# Patient Record
Sex: Male | Born: 1950 | Race: White | Hispanic: No | Marital: Married | Smoking: Never smoker
Health system: Southern US, Community
[De-identification: ages and names within clinical notes are randomized; demographics above are authoritative.]

## PROBLEM LIST (undated history)

## (undated) DIAGNOSIS — E7800 Pure hypercholesterolemia, unspecified: Secondary | ICD-10-CM

## (undated) DIAGNOSIS — I219 Acute myocardial infarction, unspecified: Secondary | ICD-10-CM

## (undated) DIAGNOSIS — I2510 Atherosclerotic heart disease of native coronary artery without angina pectoris: Secondary | ICD-10-CM

## (undated) DIAGNOSIS — IMO0001 Reserved for inherently not codable concepts without codable children: Secondary | ICD-10-CM

## (undated) HISTORY — DX: Reserved for inherently not codable concepts without codable children: IMO0001

---

## 2006-05-31 DIAGNOSIS — I2510 Atherosclerotic heart disease of native coronary artery without angina pectoris: Secondary | ICD-10-CM

## 2006-05-31 HISTORY — PX: CARDIAC CATHETERIZATION: SHX172

## 2006-05-31 HISTORY — DX: Atherosclerotic heart disease of native coronary artery without angina pectoris: I25.10

## 2006-09-08 ENCOUNTER — Inpatient Hospital Stay (HOSPITAL_COMMUNITY): Admission: EM | Admit: 2006-09-08 | Discharge: 2006-09-10 | Payer: Self-pay | Admitting: Emergency Medicine

## 2006-12-30 HISTORY — PX: CORONARY ANGIOPLASTY: SHX604

## 2007-01-02 ENCOUNTER — Inpatient Hospital Stay (HOSPITAL_COMMUNITY): Admission: RE | Admit: 2007-01-02 | Discharge: 2007-01-03 | Payer: Self-pay | Admitting: Interventional Cardiology

## 2008-08-06 ENCOUNTER — Emergency Department (HOSPITAL_COMMUNITY): Admission: EM | Admit: 2008-08-06 | Discharge: 2008-08-06 | Payer: Self-pay | Admitting: Emergency Medicine

## 2010-10-13 NOTE — Discharge Summary (Signed)
Chad Savage, Chad Savage                 ACCOUNT NO.:  [id number]   MEDICAL RECORD NO.:  [id number]          PATIENT TYPE:  INP   LOCATION:  6533                         FACILITY:  [facility]   PHYSICIAN:  Corky Crafts, MDDATE OF BIRTH:  1951/02/28   DATE OF ADMISSION:  01/02/2007  DATE OF DISCHARGE:  01/03/2007                               DISCHARGE SUMMARY   DISCHARGE DIAGNOSES:  1. Coronary artery disease status post drug-eluting stent to the LAD  2. Hyperlipidemia, treated.  3. Long-term medication use.   HOSPITAL COURSE:  [name] is a 60 year old male patient who was  admitted to [facility] for cardiac catheterization on January 02, 2007.  He complained of substernal chest pain as an outpatient and underwent a  stress Cardiolite that showed a reversible anterior wall defect.   The patient then underwent a cardiac catheterization on January 02, 2007  that showed a 90% proximal LAD lesion; there had been a previous stent  in the mid-vessel; there was a 20% proximal in-stent restenosis; there  was a small second diagonal with an ostial 90% lesion, otherwise  angiographically normal.  EF normal.  Renal arteries showed that they  were widely patent.  Patient then underwent drug-eluting stent placement  to the LAD lesion without problem.  The patient remained in the hospital  overnight and was ready for discharge to home the following day after  ambulation.  Cardiac rehab phase I and phase II ordered.   LAB STUDIES:  Hemoglobin 13.8, hematocrit 40.9, BUN 10, creatinine 1.18.   DISCHARGE MEDICATIONS:  1. Lipitor 40 mg a day.  2. Enteric-coated aspirin 325 mg a day.  3. Plavix 75 mg a day.  4. Sublingual nitroglycerin p.r.n. chest pain.   Follow up with Dr. Eldridge Dace on January 17, 2007 at 9:45 a.m.   Clean cath site gently with soap and water; no lifting over ten pounds  for one week, no driving for two days.   The patient was discharged to home in stable and improved  condition.      Guy Franco, P.A.      Corky Crafts, MD  Electronically Signed    LB/MEDQ  D:  01/03/2007  T:  01/03/2007  Job:  [id number]

## 2010-10-13 NOTE — Cardiovascular Report (Signed)
Chad Savage, Chad Savage                 ACCOUNT NO.:  [id number]   MEDICAL RECORD NO.:  [id number]          PATIENT TYPE:  INP   LOCATION:  6533                         FACILITY:  [facility]   PHYSICIAN:  Corky Crafts, MDDATE OF BIRTH:  [birth year]   DATE OF PROCEDURE:  01/02/2007  DATE OF DISCHARGE:                            CARDIAC CATHETERIZATION   REFERRING PHYSICIAN:  Chales Salmon. Abigail Miyamoto, M.D.   PROCEDURES PERFORMED:  1. Left heart catheterization.  2. Left ventriculogram.  3. Coronary angiogram.  4. Abdominal aortogram.  5. Percutaneous coronary intervention  of the left anterior      descending.   OPERATOR:  Corky Crafts, MD., and primary operator on the  diagnostics was Veverly Fells. Skains, MD.   INDICATIONS:  Abnormal stress test and stable angina.   PROCEDURE NARRATIVE:  The risks and benefits of cardiac catheterization  were explained to the patient, and informed consent was obtained. The  patient was brought to the catheterization lab where he was prepped and  draped in the usual sterile fashion.  His right groin was infiltrated  with 1% lidocaine.  A 6-French arterial sheath was placed into his right  femoral artery using the modified Seldinger technique.  Left coronary  artery angiography was performed using JL-4 4.0 catheter.  The catheter  was advanced to the vessel ostium under fluoroscopic guidance.  Digital  angiography was performed in multiple projections using hand injection  of contrast. Right coronary artery angiography was then performed using  a JR-4.0 catheter.  This catheter was advanced to the vessel ostium  under fluoroscopic guidance.  Digital angiography was performed in  multiple projections using hand injection of contrast.  A pigtail  catheter was advanced to the ascending aorta and across the aortic valve  under fluoroscopic guidance.  Power injection of contrast was performed  in the third-degree RAO position to image the left ventricle.   The  catheter was pulled back under continuous hemodynamic pressure  monitoring. The catheter was pulled back to the level of the renal  arteries.  Power injection of contrast was performed in the AP  projection to visualize the abdominal aorta.  The PCI of the LAD was  performed.  Please see below for details.  The sheath was removed, and a  StarClose was deployed for hemostasis because the patient had a vagal  response during the catheterization.   FINDINGS:  Left main was widely patent.   The left circumflex was a large vessel.  Proximally, there were minor  luminal irregularities.  There is a large OM-1 with a 25% lesion along  with other minor irregularities.  The ramus is a small vessel with  moderate atherosclerosis proximally.   The left anterior descending was a large vessel.  There is a 90%  proximal lesion.  There is a patent mid vessel stent.  There was an area  of 20% in-stent restenosis within the proximal portion of the stent.  There is a small first diagonal. There is a small second diagonal at the  distal portion of the stent which had an ostial 90% lesion.  There  appeared to be an area of bridging in the mid LAD as well.   The right coronary artery was a medium-size dominant vessel.  There is a  small ectatic segment in the mid portion of the vessel. There is a  patent mid vessel stent.   Left ventriculogram shows normal left ventricular function with  estimated ejection fraction of 55%.   The abdominal aortogram showed no abdominal aortic aneurysm.  There are  single renal arteries bilaterally, both of which were widely patent.   HEMODYNAMIC RESULTS:  Left ventricular pressure of 101/71 with an LVEDP  of 13 mmHg.  Aortic pressure of 102/57 with a mean aortic pressure of 80  mmHg.   PERCUTANEOUS CORONARY INTERVENTION NARRATIVE:  A CLS 3.5 guiding  catheter was advanced in the ascending aorta and into the ostium of the  left main.  A Prowater wire was placed  across the lesion.  Angiomax was  used for anticoagulation.  A 2.5 x 12 mm Maverick balloon was placed  across the lesion and deployed at 10 atmospheres for 20 seconds.  A 2.5  x 16 mm TAXUS stent was then deployed across the lesion at 14  atmospheres for 28 seconds.  An IVUS was performed which showed that the  stented area with some under deployed. During the IVUS, the patient  developed anterior ST-segment-elevation. Intracoronary nitroglycerin was  administered along with intracoronary verapamil.  His ECG changes  resolved.  The patient developed bradycardia as well and some  hypotension.  He was given 0.5 mg of atropine with some relief.  Subsequently, the stented area was postdilated with a 3.0 x 12 mm  Quantum Maverick balloon. In the distal portion of the stented area,  this was inflated to 12 atmospheres for 39 seconds.  In a more proximal  portion of the stented area, the balloon was inflated to 16 atmospheres  for 28 seconds.  There was an excellent angiographic result with TIMI-3  flow.   IMPRESSION:  1. Successful percutaneous coronary intervention  of the left anterior      descending with a 2.5 x 60 mm TAXUS which was postdilated with a      3.0 x 12 Quantum balloon.  2. Patent mid right coronary artery stent.  3. Normal left ventricular function.  4. No renal artery stenosis.   RECOMMENDATIONS:  The patient will continue with aspirin and Plavix  indefinitely given the aggressive nature of his atherosclerosis.  We  will change his statin  to Lipitor in an attempt for a bigger anti-  inflammatory effect. The patient will be watched overnight, and if there  are no complications, we will plan on discharging him in the morning.      Corky Crafts, MD  Electronically Signed     JSV/MEDQ  D:  01/02/2007  T:  01/02/2007  Job:  [id number]   cc:   Chales Salmon. Abigail Miyamoto, M.D.

## 2010-10-16 NOTE — Cardiovascular Report (Signed)
NAMEOAK, DOREY                 ACCOUNT NO.:  [id number]   MEDICAL RECORD NO.:  [id number]          PATIENT TYPE:  INP   LOCATION:  6527                         FACILITY:  [facility]   PHYSICIAN:  Corky Crafts, MDDATE OF BIRTH:  Dec 16, 1950   DATE OF PROCEDURE:  09/09/2006  DATE OF DISCHARGE:                            CARDIAC CATHETERIZATION   REFERRING:  Dr. Corliss Marcus and Dr. Henrine Screws.   PROCEDURES PERFORMED:  Left heart catheterization, left ventriculogram,  coronary angiogram, IVUS of the right coronary artery, percutaneous  coronary intervention of the left anterior descending artery and  percutaneous coronary intervention of the right coronary artery.   OPERATOR:  Dr. Eldridge Dace.   INDICATIONS:  Non-ST-segment elevation MI.   PROCEDURE NARRATIVE:  The risks and benefits of cardiac catheterization  were explained to the patient, and informed consent was obtained.  The  patient was brought to the cath lab.  He was prepped and draped in the  usual sterile fashion.  His right groin was infiltrated with 1%  lidocaine.  A 6-French arterial sheath was placed into his right femoral  artery using the modified Seldinger technique.  Left coronary artery  angiography was performed using a JL-4.0 catheter.  The catheter was  advanced to the vessel ostium under fluoroscopic guidance.  Digital  angiography was performed in multiple projections using hand injection  of contrast.  Right coronary artery angiography was then performed using  a JR-4.0 catheter.  The catheter was advanced into the vessel ostium  under fluoroscopic guidance.  Digital angiography was performed in  multiple projections using hand injection of contrast.  A pigtail  catheter was then advanced into the ascending aorta and across the  aortic valve under fluoroscopic guidance.  Power injection of contrast  was performed in the 30-degree RAO position, visualized the left  ventricle.  The catheter was pulled  back under continuous hemodynamic  pressure monitoring.  The PCI of the LAD and then PCI of the RCA were  then performed.  Please see below for details.  The sheath was removed,  and a Starclose was deployed for hemostasis for patient comfort.  Heparin and Integrilin were used for anticoagulation during the  procedure.   FINDINGS:  The left main was widely patent.  The left circumflex had a  diffuse 25% proximal lesion.  There was a large OM1 with mild luminal  irregularities.  Left anterior descending was a medium-sized vessel with  diffuse mild disease proximally.  In the mid vessel, there was a focal  area of 95% stenosis at the origin of a small second diagonal.  The  first diagonal was also a small vessel which was patent.  There were  mild irregularities in the distal vessel.  The right coronary artery was  a medium-sized dominant vessel.  There is mild ectasia noted in the mid  segment.  There is a hazy 60% stenosis at the distal edge of the ectatic  area.  The remainder of the vessel had mild irregularities.  The left  ventriculogram showed normal left ventricular function with no mitral  regurgitation.  The estimated ejection fraction was 60%.   HEMODYNAMICS:  Left ventricular pressure of 95/3 with an LVEDP of 13  mmHg.  Aortic pressure of 94/55 with a mean aortic pressure of 72 mmHg.  PCI of the LAD was then performed.  A CLS 3.5 guiding catheter was  placed in the ostium of the left main under fluoroscopic guidance.  A  Prowater wire was placed down in the LAD.  A 2.25 x 9-mm Maverick  balloon was placed across the lesion and inflated to 8 atmospheres for  24 seconds and then at 7 atmospheres for 15 seconds.  A 2.5 x 12-mm  Taxus stent was then deployed at 12 atmospheres for 34 seconds across  the lesion in the mid LAD.  The stent was then post dilated with a 2.75  x 8-mm Quantum balloon inflated to 18 atmospheres for 22 seconds and  then to 18 atmospheres for 14 seconds.   There was an excellent  angiographic result.  There was TIMI 3 flow with no residual stenosis.  A JR-4 guiding catheter was then placed in the ostium of the right  coronary artery under fluoroscopic guidance.  A Prowater wire was  advanced across the lesion.  An IVUS catheter was placed down the  vessel.  In the area just distal to the ectatic segment, there is a  cross-sectional area of 2.2 mm, which was significantly stenotic  compared to the reference vessel.  A 2.5 x 12-mm Taxus stent was placed  across that lesion and deployed at 12 atmospheres for 35 seconds.  The  2.75 x 8-mm Quantum balloon was placed in the proximal edge of the stent  with part of it hanging into the aneurysm and deployed at 16 atmospheres  for 35 seconds.  Balloon was then advanced more distally and inflated to  14 atmospheres for 14 seconds to post dilate the rest of the stent.  There was an excellent angiographic result with no residual stenosis.   IMPRESSION:  1. Two-vessel coronary artery disease with normal left ventricular      function.  2. Successful drug-eluting stent placement into the mid LAD with 2.5 x      12-mm Taxus which was post dilated to approximately 2.87 mm in      diameter.  3. Successful drug-eluting stent placement in the mid right coronary      artery with 2.5 x 12-mm Taxus stent after an IVUS revealed a      significant mid RCA lesion.  4. Normal ventricular function.  5. Normal hemodynamics.   RECOMMENDATIONS:  The patient to continue aspirin and Plavix  indefinitely.  He will continue Integrilin for 18 hours and will use  secondary prevention with lipid-lowering therapy and beta blocker as  well indefinitely.  Assuming that there are no bleeding complications,  he will likely be able to be discharged tomorrow.      Corky Crafts, MD  Electronically Signed     JSV/MEDQ  D:  09/09/2006  T:  09/10/2006  Job:  [id number]

## 2010-10-16 NOTE — Discharge Summary (Signed)
Chad Savage, Chad Savage                 ACCOUNT NO.:  [id number]   MEDICAL RECORD NO.:  [id number]          PATIENT TYPE:  INP   LOCATION:  6527                         FACILITY:  [facility]   PHYSICIAN:  Corky Crafts, MDDATE OF BIRTH:  [birth year]   DATE OF ADMISSION:  09/08/2006  DATE OF DISCHARGE:  09/10/2006                               DISCHARGE SUMMARY   DISCHARGE DIAGNOSES:  1. Non-ST segment elevated myocardial infarction, status post drug-      eluting stent placement to the mid left anterior descending and mid      right coronary artery.  Normal left ventricular function.  2. Early family history of coronary artery disease.  3. Dyslipidemia, treated.   HOSPITAL COURSE:  Mr. Duerson is a 60 year old male patient who presented  to Dr. Recardo Evangelist office on September 08, 2006 with an intermittent 2 week  history of chest pain.  The pain had been worse over the past 48 hours.  EKG was reported as normal upon presentation to Dr. Recardo Evangelist office.   The patient was then transported to [facility] and was pain free upon  admission.  His initial troponin was 0.13.  Chest x-ray showed a  slightly tortuous aorta; otherwise, not acute.   Lab studies done, during the patient's hospital stay, include a white  count of 5.9, hemoglobin of 14.4, hematocrit 42.1, platelets 179.  Sodium 141, potassium 3.8, BUN 7, creatinine 0.96.  LFTs normal.  Maximum CK 139, MB fraction of 3.9, maximum troponin of 0.40.  Total  cholesterol 221, triglycerides 249, LDL 140, HDL 31.  TSH 1.558.   On September 09, 2006, the patient was then taken to the cardiac  catheterization lab under the guidance of Dr. Everette Rank.  Left main  was mildly patent, left circumflex had a proximal 25% lesion; otherwise,  just mild irregularities.  The LAD had a 95% lesion of the second  diagonal with mild irregularities noted in the distal vessel.  RCA had a  hazy 60% stenosis in the mid segment with the remainder of the vessel  with mild irregularities.  LV graft showed normal function.  No MR.  LV  EDP 13 mmHg.  Patient then underwent percutaneous intervention of the  LAD utilizing a drug-eluting stent with good results.  PCI of the RCA  required deployment of a TAXUS stent as well.   The patient remained on Integrilin for 18 hours.  He will need to remain  on aspirin and Plavix indefinitely.  Patient was also placed on a lipid  lowering agent.  Cardiac rehab was also consulted.   By September 10, 2006 the patient was ready for discharge home.   DISCHARGE MEDICATIONS:  1. Plavix 75 mg a day.  2. Aspirin 325 mg a day.  3. Pravastatin 40 mg a day.  4. Metoprolol ER 25 mg a day.   Follow up with Dr. Amil Amen in 1-2 weeks.  No heavy lifting for over 10  pounds for 1 week.  No driving for 2 days.  Increase activity slowly, as  instructed by cardiac rehab.  The patient is  to call for any questions  or concerns.      Guy Franco, P.A.      Corky Crafts, MD  Electronically Signed    LB/MEDQ  D:  10/28/2006  T:  10/29/2006  Job:  [id number]   cc:   Chales Salmon. Abigail Miyamoto, M.D.

## 2011-03-15 LAB — BASIC METABOLIC PANEL
BUN: 10
BUN: 15
CO2: 24
CO2: 25
Chloride: 108
Chloride: 108
Creatinine, Ser: 1.18
GFR calc non Af Amer: >60
Glucose, Bld: 95
Potassium: 4.2
Potassium: 4.4
Sodium: 141

## 2011-03-15 LAB — CBC
HCT: 40.9
HCT: 44.8
Hemoglobin: 13.8
Hemoglobin: 15.1
MCHC: 33.8
MCV: 87.7
MCV: 88.7
RBC: 5.05
RDW: 13.3
RDW: 13.5

## 2011-03-15 LAB — CK TOTAL AND CKMB (NOT AT ARMC)

## 2011-03-15 LAB — PROTIME-INR: INR: 1

## 2011-07-30 DIAGNOSIS — I712 Thoracic aortic aneurysm, without rupture, unspecified: Secondary | ICD-10-CM | POA: Insufficient documentation

## 2011-07-30 DIAGNOSIS — IMO0001 Reserved for inherently not codable concepts without codable children: Secondary | ICD-10-CM

## 2011-07-30 HISTORY — DX: Reserved for inherently not codable concepts without codable children: IMO0001

## 2011-08-17 ENCOUNTER — Other Ambulatory Visit: Payer: Self-pay | Admitting: Interventional Cardiology

## 2011-08-17 DIAGNOSIS — I712 Thoracic aortic aneurysm, without rupture, unspecified: Secondary | ICD-10-CM

## 2011-08-23 ENCOUNTER — Ambulatory Visit
Admission: RE | Admit: 2011-08-23 | Discharge: 2011-08-23 | Disposition: A | Discharge status: Home / Self Care | Payer: BC Managed Care – PPO | Source: Ambulatory Visit | Attending: Interventional Cardiology | Admitting: Interventional Cardiology

## 2011-08-23 ENCOUNTER — Other Ambulatory Visit: Payer: Self-pay | Admitting: Interventional Cardiology

## 2011-08-23 DIAGNOSIS — I712 Thoracic aortic aneurysm, without rupture, unspecified: Secondary | ICD-10-CM

## 2011-08-23 DIAGNOSIS — IMO0001 Reserved for inherently not codable concepts without codable children: Secondary | ICD-10-CM

## 2011-08-23 MED ORDER — GADOBENATE DIMEGLUMINE 529 MG/ML IV SOLN
16.0000 mL | Freq: Once | INTRAVENOUS | Status: AC | PRN
  Administered 2011-08-23: 16 mL via INTRAVENOUS

## 2012-06-02 ENCOUNTER — Other Ambulatory Visit: Payer: Self-pay | Admitting: Urology

## 2012-06-19 ENCOUNTER — Encounter (HOSPITAL_BASED_OUTPATIENT_CLINIC_OR_DEPARTMENT_OTHER): Payer: Self-pay | Admitting: *Deleted

## 2012-06-19 NOTE — Progress Notes (Signed)
To [facility] at 0615-Istat on arrival,Ekg with chart-denies any cardiac symptoms-exercises 4x/ week.Npo after Mn-

## 2012-06-19 NOTE — H&P (Signed)
ve Problems Problems  1. 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 is a 62 yo WM who I saw in 2009 with bilateral hydroceles.  We were going to fix them but he was recently on plavix for drug eluting stents and he was not cleared.  He continues to have bother from the hydroceles but they haven't grown much.  He is voiding ok but has some frequency.  He has nocturia x 1.  He has no obstructive symptoms or hematuria.   Past Medical History Problems  1. History of  Cath Stent Placement 2. History of  Esophageal Reflux 530.81 3. History of  Heart Disease 429.9 4. History of  Hypercholesterolemia 272.0  Surgical History Problems  1. History of  Eye Surgery 2. History of  Heart Surgery 3. History of  Leg Repair 4. History of  Wrist Surgery  Current Meds 1. Aspirin 81 MG Oral Tablet; Therapy: (Recorded:26Dec2013) to 2. Centrum Silver TABS; Therapy: (Recorded:26Dec2013) to 3. Glucosamine Chondroitin TABS; Therapy: (Recorded:26Dec2013) to 4. Plavix TABS; Therapy: (Recorded:26Dec2013) to  Allergies Medication  1. Sudafed Cough LIQD 2. Demerol TABS  Family History Problems  1. Family history of  Acute Myocardial Infarction V17.3 2. Maternal history of  Death In The Family Father lung ca. 3. Maternal history of  Death In The Family Mother stroke 4. Maternal history of  Family Health Status Number Of Children one son, 2 daughters 5. Maternal history of  Lung Cancer V16.1 6. Fraternal history of  Stroke Syndrome V17.1 7. Paternal history of  Thyroid Disorder V18.19  Social History Problems    Caffeine Use 1-2 soda per day   Marital History - Widowed   Occupation: Chartered certified accountant Denied    History of  Alcohol Use   History of  Tobacco Use V15.82  Review of Systems Genitourinary, constitutional, skin, eye, otolaryngeal, hematologic/lymphatic, cardiovascular, pulmonary, endocrine, musculoskeletal, gastrointestinal, neurological  and psychiatric system(s) were reviewed and pertinent findings if present are noted.  Genitourinary: urinary frequency, feelings of urinary urgency, nocturia, incontinence (minimal UUI) and erectile dysfunction.  ENT: sore throat and sinus problems.  Musculoskeletal: back pain.    Vitals Vital Signs [Data Includes: Last 1 Day]  26Dec2013 03:40PM  BMI Calculated: 29.32 BSA Calculated: 1.87 Height: 5 ft 5 in Weight: 176 lb  Blood Pressure: 142 / 85 Heart Rate: 79  Physical Exam Constitutional: Well nourished and well developed . No acute distress.  ENT:. The ears and nose are normal in appearance.  Neck: The appearance of the neck is normal and no neck mass is present.  Pulmonary: No respiratory distress and normal respiratory rhythm and effort.  Cardiovascular: Heart rate and rhythm are normal . No peripheral edema.  Abdomen: The abdomen is soft and nontender. No masses are palpated. No CVA tenderness. No hernias are palpable. No hepatosplenomegaly noted.  Rectal: The prostate exam was deferred.  Genitourinary: Examination of the penis demonstrates no lesions and a normal meatus. The scrotum is normal in appearance. Examination of the right scrotum demonstrates a hydrocele. Examination of the left scrotum demostrates a hydrocele. The right testis is nonpalpable. The left testis is nonpalpable.  Lymphatics: The femoral and inguinal nodes are not enlarged or tender.  Skin: Normal skin turgor, no visible rash and no visible skin lesions.  Neuro/Psych:. Mood and affect are appropriate.    Results/Data Urine [Data Includes: Last 1 Day]   26Dec2013  COLOR YELLOW   APPEARANCE CLEAR   SPECIFIC GRAVITY 1.015  pH 7.0   GLUCOSE NEG mg/dL  BILIRUBIN NEG   KETONE NEG mg/dL  BLOOD NEG   PROTEIN NEG mg/dL  UROBILINOGEN 0.2 mg/dL  NITRITE NEG   LEUKOCYTE ESTERASE TRACE   SQUAMOUS EPITHELIAL/HPF NONE SEEN   WBC 0-2 WBC/hpf  RBC NONE SEEN RBC/hpf  BACTERIA NONE SEEN   CRYSTALS NONE SEEN    CASTS NONE SEEN    Assessment Assessed  1. 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   Plan Health Maintenance (V70.0)  1. UA With REFLEX  Done: 26Dec2013 03:27PM Testicular Hydrocele (603.9)  2. Follow-up Schedule Surgery Office  Follow-up  Requested for: 26Dec2013   I will set him up for bilateral hydrocelectomy and have reviewed the risks of bleeding, infection, testicular injury, recurrent hydrocele, persistent pain and scarring, thrombotic events and anesthetic complications.  I will have him cleared by his cardiologist.   Discussion/Summary  CC: Dr. Berenda Morale and Dr. Eldridge Dace.

## 2012-06-19 NOTE — Progress Notes (Signed)
Have been unable to contact patient.Dr Belva Crome office has been made aware.Dr Okey Dupre made aware of cardiac history present in epic from 2008-unless a change in cardiac status and plavix and ASA held, will be OK to proceed in am.

## 2012-06-20 ENCOUNTER — Encounter (HOSPITAL_BASED_OUTPATIENT_CLINIC_OR_DEPARTMENT_OTHER): Payer: Self-pay | Admitting: Anesthesiology

## 2012-06-20 ENCOUNTER — Ambulatory Visit (HOSPITAL_BASED_OUTPATIENT_CLINIC_OR_DEPARTMENT_OTHER)
Admission: RE | Admit: 2012-06-20 | Discharge: 2012-06-20 | Disposition: A | Discharge status: Home / Self Care | Payer: BC Managed Care – PPO | Source: Ambulatory Visit | Attending: Urology | Admitting: Urology

## 2012-06-20 ENCOUNTER — Encounter (HOSPITAL_BASED_OUTPATIENT_CLINIC_OR_DEPARTMENT_OTHER): Payer: Self-pay

## 2012-06-20 ENCOUNTER — Encounter (HOSPITAL_BASED_OUTPATIENT_CLINIC_OR_DEPARTMENT_OTHER)
Admission: RE | Disposition: A | Discharge status: Home / Self Care | Payer: Self-pay | Source: Ambulatory Visit | Attending: Urology

## 2012-06-20 ENCOUNTER — Ambulatory Visit (HOSPITAL_BASED_OUTPATIENT_CLINIC_OR_DEPARTMENT_OTHER): Payer: BC Managed Care – PPO | Admitting: Anesthesiology

## 2012-06-20 DIAGNOSIS — Z7902 Long term (current) use of antithrombotics/antiplatelets: Secondary | ICD-10-CM | POA: Insufficient documentation

## 2012-06-20 DIAGNOSIS — K219 Gastro-esophageal reflux disease without esophagitis: Secondary | ICD-10-CM | POA: Insufficient documentation

## 2012-06-20 DIAGNOSIS — I519 Heart disease, unspecified: Secondary | ICD-10-CM | POA: Insufficient documentation

## 2012-06-20 DIAGNOSIS — Z7982 Long term (current) use of aspirin: Secondary | ICD-10-CM | POA: Insufficient documentation

## 2012-06-20 DIAGNOSIS — N433 Hydrocele, unspecified: Secondary | ICD-10-CM | POA: Insufficient documentation

## 2012-06-20 DIAGNOSIS — E7800 Pure hypercholesterolemia, unspecified: Secondary | ICD-10-CM | POA: Insufficient documentation

## 2012-06-20 HISTORY — DX: Acute myocardial infarction, unspecified: I21.9

## 2012-06-20 HISTORY — DX: Atherosclerotic heart disease of native coronary artery without angina pectoris: I25.10

## 2012-06-20 HISTORY — DX: Pure hypercholesterolemia, unspecified: E78.00

## 2012-06-20 HISTORY — PX: HYDROCELE EXCISION: SHX482

## 2012-06-20 LAB — POCT I-STAT 4, (NA,K, GLUC, HGB,HCT): Sodium: 139 mEq/L (ref 135–145)

## 2012-06-20 SURGERY — HYDROCELECTOMY
Anesthesia: General | Site: Groin | Laterality: Bilateral | Wound class: Clean

## 2012-06-20 MED ORDER — FENTANYL CITRATE 0.05 MG/ML IJ SOLN
25.0000 ug | INTRAMUSCULAR | Status: DC | PRN
  Filled 2012-06-20: qty 1

## 2012-06-20 MED ORDER — SODIUM CHLORIDE 0.9 % IJ SOLN
3.0000 mL | INTRAMUSCULAR | Status: DC | PRN
  Filled 2012-06-20: qty 3

## 2012-06-20 MED ORDER — LACTATED RINGERS IV SOLN
INTRAVENOUS | Status: DC
  Filled 2012-06-20: qty 1000

## 2012-06-20 MED ORDER — LIDOCAINE HCL (CARDIAC) 20 MG/ML IV SOLN
INTRAVENOUS | Status: DC | PRN
  Administered 2012-06-20: 50 mg via INTRAVENOUS

## 2012-06-20 MED ORDER — SODIUM CHLORIDE 0.9 % IJ SOLN
3.0000 mL | Freq: Two times a day (BID) | INTRAMUSCULAR | Status: DC
  Filled 2012-06-20: qty 3

## 2012-06-20 MED ORDER — BUPIVACAINE HCL (PF) 0.25 % IJ SOLN
INTRAMUSCULAR | Status: DC | PRN
  Administered 2012-06-20: 7 mL

## 2012-06-20 MED ORDER — ONDANSETRON HCL 4 MG/2ML IJ SOLN
4.0000 mg | Freq: Four times a day (QID) | INTRAMUSCULAR | Status: DC | PRN
  Filled 2012-06-20: qty 2

## 2012-06-20 MED ORDER — CEFAZOLIN SODIUM-DEXTROSE 2-3 GM-% IV SOLR
2.0000 g | INTRAVENOUS | Status: AC
  Administered 2012-06-20: 2 g via INTRAVENOUS
  Filled 2012-06-20: qty 50

## 2012-06-20 MED ORDER — OXYCODONE HCL 5 MG PO TABS
5.0000 mg | ORAL_TABLET | ORAL | Status: DC | PRN
  Filled 2012-06-20: qty 2

## 2012-06-20 MED ORDER — LACTATED RINGERS IV SOLN
INTRAVENOUS | Status: DC
  Administered 2012-06-20: 07:00:00 via INTRAVENOUS
  Filled 2012-06-20: qty 1000

## 2012-06-20 MED ORDER — PROPOFOL 10 MG/ML IV BOLUS
INTRAVENOUS | Status: DC | PRN
  Administered 2012-06-20: 150 mg via INTRAVENOUS

## 2012-06-20 MED ORDER — FENTANYL CITRATE 0.05 MG/ML IJ SOLN
INTRAMUSCULAR | Status: DC | PRN
  Administered 2012-06-20: 50 ug via INTRAVENOUS
  Administered 2012-06-20 (×2): 25 ug via INTRAVENOUS

## 2012-06-20 MED ORDER — HYDROCODONE-ACETAMINOPHEN 5-325 MG PO TABS: 1.0000 | ORAL_TABLET | Freq: Four times a day (QID) | ORAL | Status: DC | PRN

## 2012-06-20 MED ORDER — SODIUM CHLORIDE 0.9 % IV SOLN
250.0000 mL | INTRAVENOUS | Status: DC | PRN
  Filled 2012-06-20: qty 250

## 2012-06-20 MED ORDER — ACETAMINOPHEN 325 MG PO TABS
650.0000 mg | ORAL_TABLET | ORAL | Status: DC | PRN
  Filled 2012-06-20: qty 2

## 2012-06-20 MED ORDER — ONDANSETRON HCL 4 MG/2ML IJ SOLN
INTRAMUSCULAR | Status: DC | PRN
  Administered 2012-06-20: 4 mg via INTRAVENOUS

## 2012-06-20 MED ORDER — ACETAMINOPHEN 650 MG RE SUPP
650.0000 mg | RECTAL | Status: DC | PRN
  Filled 2012-06-20: qty 1

## 2012-06-20 MED ORDER — PROMETHAZINE HCL 25 MG/ML IJ SOLN
6.2500 mg | INTRAMUSCULAR | Status: DC | PRN
  Filled 2012-06-20: qty 1

## 2012-06-20 MED ORDER — HYDROCODONE-ACETAMINOPHEN 5-325 MG PO TABS
1.0000 | ORAL_TABLET | Freq: Four times a day (QID) | ORAL | Status: DC | PRN
  Administered 2012-06-20: 1 via ORAL
  Filled 2012-06-20: qty 1

## 2012-06-20 MED ORDER — MIDAZOLAM HCL 5 MG/5ML IJ SOLN
INTRAMUSCULAR | Status: DC | PRN
  Administered 2012-06-20: 2 mg via INTRAVENOUS

## 2012-06-20 SURGICAL SUPPLY — 35 items
BANDAGE GAUZE ELAST BULKY 4 IN (GAUZE/BANDAGES/DRESSINGS) ×2 IMPLANT
BLADE SURG 15 STRL LF DISP TIS (BLADE) ×1 IMPLANT
BLADE SURG 15 STRL SS (BLADE) ×2
BLADE SURG ROTATE 9660 (MISCELLANEOUS) ×2 IMPLANT
CANISTER SUCTION 1200CC (MISCELLANEOUS) IMPLANT
CANISTER SUCTION 2500CC (MISCELLANEOUS) ×2 IMPLANT
CLEANER CAUTERY TIP 5X5 PAD (MISCELLANEOUS) IMPLANT
CLOTH BEACON ORANGE TIMEOUT ST (SAFETY) ×2 IMPLANT
COVER MAYO STAND STRL (DRAPES) ×2 IMPLANT
COVER TABLE BACK 60X90 (DRAPES) ×2 IMPLANT
DISSECTOR ROUND CHERRY 3/8 STR (MISCELLANEOUS) IMPLANT
DRAIN PENROSE 18X1/4 LTX STRL (WOUND CARE) ×2 IMPLANT
DRAPE PED LAPAROTOMY (DRAPES) ×2 IMPLANT
ELECT REM PT RETURN 9FT ADLT (ELECTROSURGICAL) ×2
ELECTRODE REM PT RTRN 9FT ADLT (ELECTROSURGICAL) IMPLANT
GLOVE BIO SURGEON STRL SZ 6.5 (GLOVE) ×2 IMPLANT
GLOVE BIO SURGEON STRL SZ7 (GLOVE) ×1 IMPLANT
GLOVE ECLIPSE 6.0 STRL STRAW (GLOVE) ×2 IMPLANT
GLOVE SURG SS PI 8.0 STRL IVOR (GLOVE) ×2 IMPLANT
GOWN PREVENTION PLUS LG XLONG (DISPOSABLE) ×2 IMPLANT
GOWN STRL REIN XL XLG (GOWN DISPOSABLE) ×2 IMPLANT
NDL HYPO 25X1 1.5 SAFETY (NEEDLE) ×1 IMPLANT
NEEDLE HYPO 25X1 1.5 SAFETY (NEEDLE) ×2 IMPLANT
NS IRRIG 500ML POUR BTL (IV SOLUTION) ×2 IMPLANT
PACK BASIN DAY SURGERY FS (CUSTOM PROCEDURE TRAY) ×2 IMPLANT
PAD CLEANER CAUTERY TIP 5X5 (MISCELLANEOUS)
PENCIL BUTTON HOLSTER BLD 10FT (ELECTRODE) ×2 IMPLANT
SUPPORT SCROTAL LG STRP (MISCELLANEOUS) ×2 IMPLANT
SUT CHROMIC 3 0 SH 27 (SUTURE) ×5 IMPLANT
SYR CONTROL 10ML LL (SYRINGE) ×2 IMPLANT
TOWEL OR 17X24 6PK STRL BLUE (TOWEL DISPOSABLE) ×4 IMPLANT
TRAY DSU PREP LF (CUSTOM PROCEDURE TRAY) ×2 IMPLANT
TUBE CONNECTING 12X1/4 (SUCTIONS) ×2 IMPLANT
WATER STERILE IRR 500ML POUR (IV SOLUTION) ×2 IMPLANT
YANKAUER SUCT BULB TIP NO VENT (SUCTIONS) ×2 IMPLANT

## 2012-06-20 NOTE — Anesthesia Preprocedure Evaluation (Addendum)
Anesthesia Evaluation  Patient identified by MRN, date of birth, ID band Patient awake    Reviewed: Allergy & Precautions, H&P , NPO status , Patient's Chart, lab work & pertinent test results  Airway Mallampati: II TM Distance: >3 FB Neck ROM: Full    Dental No notable dental hx.    Pulmonary neg pulmonary ROS,  breath sounds clear to auscultation  Pulmonary exam normal       Cardiovascular Exercise Tolerance: Good + CAD, + Past MI, + Cardiac Stents (x3) and + Peripheral Vascular Disease (4cm stable ascending aortic aneurysm) Rhythm:Regular Rate:Normal     Neuro/Psych negative neurological ROS  negative psych ROS   GI/Hepatic negative GI ROS, Neg liver ROS,   Endo/Other  negative endocrine ROS  Renal/GU negative Renal ROS  negative genitourinary   Musculoskeletal negative musculoskeletal ROS (+)   Abdominal   Peds negative pediatric ROS (+)  Hematology negative hematology ROS (+)   Anesthesia Other Findings Upper front caps  Reproductive/Obstetrics negative OB ROS                          Anesthesia Physical Anesthesia Plan  ASA: III  Anesthesia Plan: General   Post-op Pain Management:    Induction: Intravenous  Airway Management Planned: LMA  Additional Equipment:   Intra-op Plan:   Post-operative Plan:   Informed Consent: I have reviewed the patients History and Physical, chart, labs and discussed the procedure including the risks, benefits and alternatives for the proposed anesthesia with the patient or authorized representative who has indicated his/her understanding and acceptance.   Dental advisory given  Plan Discussed with: CRNA  Anesthesia Plan Comments:         Anesthesia Quick Evaluation

## 2012-06-20 NOTE — Interval H&P Note (Signed)
History and Physical Interval Note:  06/20/2012 7:22 AM  Chad Savage  has presented today for surgery, with the diagnosis of BILATERAL HYDROCELES  The various methods of treatment have been discussed with the patient and family. After consideration of risks, benefits and other options for treatment, the patient has consented to  Procedure(s) (LRB) with comments: HYDROCELECTOMY ADULT (Bilateral) as a surgical intervention .  The patient's history has been reviewed, patient examined, no change in status, stable for surgery.  I have reviewed the patient's chart and labs.  Questions were answered to the patient's satisfaction.     [name] J

## 2012-06-20 NOTE — Brief Op Note (Signed)
06/20/2012  8:17 AM  PATIENT:  Adan A Sturgill  62 y.o. male  PRE-OPERATIVE DIAGNOSIS:  BILATERAL HYDROCELES  POST-OPERATIVE DIAGNOSIS:  BILATERAL HYDROCELES  PROCEDURE:  Procedure(s) (LRB) with comments: HYDROCELECTOMY ADULT (Bilateral)  SURGEON:  Surgeon(s) and Role:    * Anner Crete, MD - Primary  PHYSICIAN ASSISTANT:   ASSISTANTS: none   ANESTHESIA:   general  EBL:  Total I/O In: 200 [I.V.:200] Out: -   BLOOD ADMINISTERED:none  DRAINS: Penrose drain in the right and left scrotum   LOCAL MEDICATIONS USED:  MARCAINE     SPECIMEN:  No Specimen  DISPOSITION OF SPECIMEN:  N/A  COUNTS:  YES  TOURNIQUET:  * No tourniquets in log *  DICTATION: .Other Dictation: Dictation Number [id number]  PLAN OF CARE: Discharge to home after PACU  PATIENT DISPOSITION:  PACU - hemodynamically stable.   Delay start of Pharmacological VTE agent (>24hrs) due to surgical blood loss or risk of bleeding: not applicable

## 2012-06-20 NOTE — Op Note (Signed)
[name], Chad Savage                 ACCOUNT NO.:  [id number]  MEDICAL RECORD NO.:  [id number]  LOCATION:                               FACILITY:  [facility]  PHYSICIAN:  Excell Seltzer. Annabell Howells, M.D.    DATE OF BIRTH:  1950/11/23  DATE OF PROCEDURE:  06/20/2012 DATE OF DISCHARGE:                              OPERATIVE REPORT   PROCEDURE:  Bilateral hydrocelectomy.  PREOPERATIVE DIAGNOSIS:  Bilateral hydroceles.  POSTOPERATIVE DIAGNOSIS:  Bilateral hydroceles.  SURGEON:  Excell Seltzer. Annabell Howells, M.D.  ANESTHESIA:  General.  SPECIMEN:  None.  DRAINS:  Bilateral quarter-inch Penrose scrotal drains.  ESTIMATED BLOOD LOSS:  Minimal.  COMPLICATIONS:  None.  INDICATIONS:  Chad Savage is a 62 year old white male with a history of bilateral hydroceles.  When he was originally seen, he had just been placed on Plavix following placement of drug-eluting stent, and had a delay therapy, returns now with further increase in size of the hydroceles and the desire for repair.  FINDINGS OF PROCEDURE:  He was given 2 g of Ancef.  General anesthetic was induced with him in the supine position.  His genitalia was clipped. He was prepped with Betadine solution and draped in usual sterile fashion.  An anterior scrotal incision was made along the midline raphe with a knife.  The dartos was then incised with the Bovie.  The right hydrocele sac was pressed against the dartos and incised and the hydrocele was delivered from the scrotum.  The sac was opened and drained and the redundant hydrocele sac was removed.  A 3-0 chromic was used to imbricate the residual sac behind the testicle in a water bottle fashion with a running lock stitch.  Once the right hydrocelectomy had been completed, cord block was performed, approximately 4 mL of 0.25% Marcaine, and the testicle was returned to the scrotum.  The left hydrocele was then delivered through the same incision.  The redundant sac was resected.  A water bottle imbrication was  then performed once again with a running locked 3-0 chromic, and 4 mL of 0.25% Marcaine was used to perform a cord block.  At this point, the scrotal cavities were inspected.  No active bleeding was noted except along the dartos edge.  Stab wounds were placed on the right and left in the dependent portion of the scrotum and quarter-inch Penrose drains were placed adjacent to the hydrocele repair.  These were secured with towel clips while the closure was performed.  Once hemostasis was secured, the dartos was closed with a running 3-0 chromic.  The skin was then closed with interrupted vertical mattress 3- 0 chromic suture.  The drains were trimmed.  The scrotum was clean.  The towel clips were removed.  A dressing of 4x4s, fluff Kerlix, and scrotal support was applied.  The patient's anesthetic was reversed and he was moved to the recovery room in stable condition.  There were no complications.     Excell Seltzer. Annabell Howells, M.D.     JJW/MEDQ  D:  06/20/2012  T:  06/20/2012  Job:  [id number]

## 2012-06-20 NOTE — Transfer of Care (Signed)
Immediate Anesthesia Transfer of Care Note  Patient: Chad Savage  Procedure(s) Performed: Procedure(s) (LRB): HYDROCELECTOMY ADULT (Bilateral)  Patient Location: PACU  Anesthesia Type: General  Level of Consciousness: awake, oriented, sedated and patient cooperative  Airway & Oxygen Therapy: Patient Spontanous Breathing and Patient connected to face mask oxygen  Post-op Assessment: Report given to PACU RN and Post -op Vital signs reviewed and stable  Post vital signs: Reviewed and stable  Complications: No apparent anesthesia complications

## 2012-06-20 NOTE — Anesthesia Postprocedure Evaluation (Signed)
  Anesthesia Post-op Note  Patient: Chad Savage  Procedure(s) Performed: Procedure(s) (LRB): HYDROCELECTOMY ADULT (Bilateral)  Patient Location: PACU  Anesthesia Type: General  Level of Consciousness: awake and alert   Airway and Oxygen Therapy: Patient Spontanous Breathing  Post-op Pain: mild  Post-op Assessment: Post-op Vital signs reviewed, Patient's Cardiovascular Status Stable, Respiratory Function Stable, Patent Airway and No signs of Nausea or vomiting  Last Vitals:  Filed Vitals:   06/20/12 0900  BP: 135/87  Pulse: 65  Temp:   Resp: 13    Post-op Vital Signs: stable   Complications: No apparent anesthesia complications

## 2012-06-20 NOTE — Anesthesia Procedure Notes (Signed)
Procedure Name: LMA Insertion Date/Time: 06/20/2012 7:30 AM Performed by: Renella Cunas D Pre-anesthesia Checklist: Patient identified, Emergency Drugs available, Suction available and Patient being monitored Patient Re-evaluated:Patient Re-evaluated prior to inductionOxygen Delivery Method: Circle System Utilized Preoxygenation: Pre-oxygenation with 100% oxygen Intubation Type: IV induction Ventilation: Mask ventilation without difficulty LMA: LMA inserted LMA Size: 4.0 Number of attempts: 1 Airway Equipment and Method: bite block Placement Confirmation: positive ETCO2 Tube secured with: Tape Dental Injury: Teeth and Oropharynx as per pre-operative assessment

## 2012-06-21 ENCOUNTER — Encounter (HOSPITAL_BASED_OUTPATIENT_CLINIC_OR_DEPARTMENT_OTHER): Payer: Self-pay | Admitting: Urology

## 2013-03-12 ENCOUNTER — Other Ambulatory Visit: Payer: Self-pay | Admitting: Interventional Cardiology

## 2013-03-12 DIAGNOSIS — I712 Thoracic aortic aneurysm, without rupture, unspecified: Secondary | ICD-10-CM

## 2013-03-19 ENCOUNTER — Other Ambulatory Visit: Payer: Self-pay | Admitting: *Deleted

## 2013-03-19 DIAGNOSIS — Z79899 Other long term (current) drug therapy: Secondary | ICD-10-CM

## 2013-03-19 DIAGNOSIS — E785 Hyperlipidemia, unspecified: Secondary | ICD-10-CM

## 2013-04-17 ENCOUNTER — Other Ambulatory Visit: Payer: BC Managed Care – PPO

## 2013-04-20 ENCOUNTER — Encounter (INDEPENDENT_AMBULATORY_CARE_PROVIDER_SITE_OTHER): Payer: Self-pay

## 2013-04-20 ENCOUNTER — Ambulatory Visit (INDEPENDENT_AMBULATORY_CARE_PROVIDER_SITE_OTHER): Payer: 59 | Admitting: Pharmacist

## 2013-04-20 DIAGNOSIS — E785 Hyperlipidemia, unspecified: Secondary | ICD-10-CM

## 2013-04-20 DIAGNOSIS — Z79899 Other long term (current) drug therapy: Secondary | ICD-10-CM

## 2013-04-20 LAB — LIPID PANEL
Cholesterol: 107 mg/dL (ref 0–200)
HDL: 36.3 mg/dL — ABNORMAL LOW (ref >39.00)
Triglycerides: 86 mg/dL (ref 0.0–149.0)
VLDL: 17.2 mg/dL (ref 0.0–40.0)

## 2013-04-20 LAB — HEPATIC FUNCTION PANEL
ALT: 15 U/L (ref 0–53)
Albumin: 3.9 g/dL (ref 3.5–5.2)
Bilirubin, Direct: 0.1 mg/dL (ref 0.0–0.3)
Total Protein: 6.9 g/dL (ref 6.0–8.3)

## 2013-04-20 MED ORDER — EZETIMIBE-SIMVASTATIN 10-40 MG PO TABS: 1.0000 | ORAL_TABLET | Freq: Every day | ORAL | Status: DC

## 2013-04-20 NOTE — Assessment & Plan Note (Addendum)
Excellent improvement in LDL and non-HDL since changing simvastatin 40 mg to Vytorin 10/40 mg qd.  Patient tolerating Vytorin well, also on Co-Q 10 300 mg qd.  Patient's diet and lifestyle acceptable, and won't make any further changes at this time.  Given his family h/o premature CAD, and his mutli-vessel disease and low HDL, will check an NMR in 4 months to assess LDL-P.  If LDL-P > 1000 nmol/L may need to consider change in therapy.  If LDL-P < 1000 nmol/L however, will continue therapy and get traditional panel moving forward.   Will recheck blood work on 08/16/13 and see me 08/21/13 to review results.

## 2013-04-20 NOTE — Progress Notes (Signed)
Patient presenting to lipid clinic today due to h/o elevated LDL and CAD.  Patient has h/o muscle/joint pain on statins, and was changed from simvastatin 40 mg  to Vytorin 10/40 mg in 12/2012 for potency reasons.  Patient tells me he is tolerating Vytorin 10/40 mg qd well, especially since taking Co-Enzyme Q-10 300 mg daily.  He also takes glucosamine 1500 mg daily.  Risk factors:  Multi-vessel stents, family h/o premature CAD, age, h/o low HDL- LDL goal < 70, non-HDL goal < 100 Meds:  Vytorin 10/40 mg qd, CholestOff 1 tablet bid, fish oil 3 g/d, Co-Enzyme Q-10 300 mg daily. Intolerant:  Lipitor, Crestor (muscle/joint aches).  Patient had joint aches with Simvastatin 40 mg in the past, but this resolved after starting CoQ-10, and has even improved further since changing to Vytorin 10/40 mg qd per patient.  Social history:  No alcohol, no tobacco use Family History:  Mother -had MI in her 49's.  Father - had MI at 40 y.o., and died of lung cancer (he worked Tree surgeon and a Education administrator w/ lead based paint).  Diet:  Patient does eat a low fat diet.  Breakfast - oatmeal, mixed nuts, raisons, water, and coffee.  Lunch - sandwich, fruit, and water.  Dinner - Chicken or fish, and a vegetable.  Patient doesn't drink soda or alcohol.  Eats an occasional dessert, but his is rare. Exercise:  Patient walks 4 days per week - 30-45 minutes each time.  Labs:   04/20/13 (Today) -   LDL 54, non-HDL 71, TG 86, HDL 36 ; LFTs WNL (on Vytorin 10/40 mg qd and CholestOff bid) 01/11/2013 - LDL 89, non-HDL 110, TC 146, HDL 36 (on simvastatin 40 mg qd and CholestOff bid)  Current Outpatient Prescriptions  Medication Sig Dispense Refill  . Coenzyme Q10 (CO Q-10) 300 MG CAPS Take 300 mg by mouth daily.      Marland Kitchen ezetimibe-simvastatin (VYTORIN) 10-40 MG per tablet Take 1 tablet by mouth daily.      . Plant Sterols and Stanols (CHOLESTOFF) 450 MG TABS Take 450 mg by mouth 2 (two) times daily.      Marland Kitchen aspirin 81 MG tablet  Take 81 mg by mouth daily.      . clopidogrel (PLAVIX) 75 MG tablet Take 75 mg by mouth daily.      . Glucosamine-Chondroit-Vit C-Mn (GLUCOSAMINE CHONDR 1500 COMPLX PO) Take by mouth daily.      Marland Kitchen HYDROcodone-acetaminophen (NORCO) 5-325 MG per tablet Take 1 tablet by mouth every 6 (six) hours as needed for pain.  30 tablet  0  . Multiple Vitamins-Minerals (CENTRUM PO) Take by mouth.      . Omega-3 Fatty Acids (FISH OIL) 1200 MG CAPS Take by mouth 3 (three) times daily.       No current facility-administered medications for this visit.   Allergies  Allergen Reactions  . Demerol [Meperidine] Other (See Comments)    Cold sweats  . Antihistamines, Chlorpheniramine-Type Rash       . Sudafed [Pseudoephedrine Hcl] Rash

## 2013-04-20 NOTE — Patient Instructions (Addendum)
1.  Continue Vytorin 10/40 mg qd. 2.  Continue CholestOff twice daily. 3.  Continue exercise regimen and diet. 4.  Check NMR / Liver blood work on 08/16/13, and see me 08/23/13 to discuss.

## 2013-06-07 ENCOUNTER — Telehealth: Payer: Self-pay | Admitting: Interventional Cardiology

## 2013-06-07 MED ORDER — SIMVASTATIN 40 MG PO TABS: 40.0000 mg | ORAL_TABLET | Freq: Every day | ORAL | Status: DC

## 2013-06-07 NOTE — Telephone Encounter (Signed)
To Chad Savage, is there another option?

## 2013-06-07 NOTE — Telephone Encounter (Signed)
New message   [place] not [place] vytorin   . The co pay is high  $ 230.00

## 2013-06-07 NOTE — Telephone Encounter (Signed)
Patient and I discussed the situation.  His LDL was elevated on simvastatin 40 mg in the past, and he also developed some muscle aches in the past on this.  He tolerates Vytorin 10/40 mg qd well, so he will likely be able to tolerate simvastatin 40 mg qd.  He failed lipitor and Crestor in past (myalgias).  Patient would like to change vytorin to simvastatin 40 mg qd for cost reasons, and if LDL is elevated in 07/2013, he would like to try and enroll into PCSK-9 clinical trial.  We will discuss in lipid clinic in 07/2013.  Rx for simvastatin sent to [facility].

## 2013-06-07 NOTE — Telephone Encounter (Signed)
Patient failed lipitor and Crestor.  Welchol will be very expensive as well.  Could do simvastatin 40 mg qd monotherapy, then try to enrol

## 2013-08-08 ENCOUNTER — Encounter: Payer: Self-pay | Admitting: Interventional Cardiology

## 2013-08-08 DIAGNOSIS — I2510 Atherosclerotic heart disease of native coronary artery without angina pectoris: Secondary | ICD-10-CM | POA: Insufficient documentation

## 2013-08-08 DIAGNOSIS — E7800 Pure hypercholesterolemia, unspecified: Secondary | ICD-10-CM | POA: Insufficient documentation

## 2013-08-16 ENCOUNTER — Other Ambulatory Visit: Payer: Medicare Other | Admitting: *Deleted

## 2013-08-16 ENCOUNTER — Encounter: Payer: Self-pay | Admitting: Interventional Cardiology

## 2013-08-16 ENCOUNTER — Ambulatory Visit (INDEPENDENT_AMBULATORY_CARE_PROVIDER_SITE_OTHER): Payer: Medicare Other | Admitting: Interventional Cardiology

## 2013-08-16 VITALS — BP 100/70 | HR 65 | Ht 65.0 in | Wt 181.0 lb

## 2013-08-16 DIAGNOSIS — IMO0001 Reserved for inherently not codable concepts without codable children: Secondary | ICD-10-CM

## 2013-08-16 DIAGNOSIS — E785 Hyperlipidemia, unspecified: Secondary | ICD-10-CM

## 2013-08-16 DIAGNOSIS — I2510 Atherosclerotic heart disease of native coronary artery without angina pectoris: Secondary | ICD-10-CM

## 2013-08-16 DIAGNOSIS — E7800 Pure hypercholesterolemia, unspecified: Secondary | ICD-10-CM

## 2013-08-16 DIAGNOSIS — I712 Thoracic aortic aneurysm, without rupture, unspecified: Secondary | ICD-10-CM

## 2013-08-16 DIAGNOSIS — Z79899 Other long term (current) drug therapy: Secondary | ICD-10-CM

## 2013-08-16 NOTE — Addendum Note (Signed)
Addended byOrlene Plum: Sevag Shearn H on: 08/16/2013 10:47 AM   Modules accepted: Orders

## 2013-08-16 NOTE — Patient Instructions (Signed)
Your physician recommends that you continue on your current medications as directed. Please refer to the Current Medication list given to you today.  Your physician wants you to follow-up in: 1 Year with Dr. Varanasi. You will receive a reminder letter in the mail two months in advance. If you don't receive a letter, please call our office to schedule the follow-up appointment.  

## 2013-08-16 NOTE — Progress Notes (Signed)
Patient ID: Chad MaxinMikel A Burack, male   DOB: 12-23-1950, 63 y.o.   MRN: [id number]    [address] [place], [place]  [address] Phone: [phone] Fax:  [phone]  Date:  08/16/2013   ID:  Chad Savage, DOB 12-23-1950, MRN [id number]  PCP:  Rozanna BoxBABAOFF, MARC E, MD      History of Present Illness: Chad MaxinMikel A Dallman is a 63 y.o. male with multivessel stents. He has some joint pains and lightheadedness when he is tired. No syncope. He had a hydrocele opration in 2014 and tolerated this well. CAD/ASCVD:  Meredeth IdeWalks , pushups for exercise. Joint pain limits him somewhat but better now. Walks 30 minutes 4 times / week. c/o Dizziness while getting up from sitting position, mild, lasting for seconds.  Denies : Chest pain.  Leg edema.  Nitroglycerin.  Orthopnea.  Palpitations.  Paroxysmal nocturnal dyspnea.  Syncope.     Wt Readings from Last 3 Encounters:  08/16/13 181 lb (82.101 kg)  06/20/12 182 lb 8 oz (82.781 kg)  06/20/12 182 lb 8 oz (82.781 kg)     Past Medical History  Diagnosis Date  . Coronary artery disease 2008  . AAA (abdominal aortic aneurysm) March 2013    4 cm without complicating features  . Hypercholesteremia   . Myocardial infarction     Current Outpatient Prescriptions  Medication Sig Dispense Refill  . aspirin 81 MG tablet Take 81 mg by mouth daily.      . clopidogrel (PLAVIX) 75 MG tablet Take 75 mg by mouth daily.      . Coenzyme Q10 (CO Q-10) 300 MG CAPS Take 300 mg by mouth daily.      . Glucosamine-Chondroit-Vit C-Mn (GLUCOSAMINE CHONDR 1500 COMPLX PO) Take by mouth daily.      . Multiple Vitamins-Minerals (CENTRUM PO) Take by mouth.      . Omega-3 Fatty Acids (FISH OIL) 1200 MG CAPS Take by mouth 3 (three) times daily.      . Plant Sterols and Stanols (CHOLESTOFF) 450 MG TABS Take 450 mg by mouth 2 (two) times daily.      . simvastatin (ZOCOR) 40 MG tablet Take 1 tablet (40 mg total) by mouth at bedtime.  30 tablet  5   No current  facility-administered medications for this visit.    Allergies:    Allergies  Allergen Reactions  . Demerol [Meperidine] Other (See Comments)    Cold sweats  . Antihistamines, Chlorpheniramine-Type Rash       . Sudafed [Pseudoephedrine Hcl] Rash    Social History:  The patient  reports that he has never smoked. He does not have any smokeless tobacco history on file. He reports that he does not drink alcohol.   Family History:  The patient's family history is not on file.   ROS:  Please see the history of present illness.  No nausea, vomiting.  No fevers, chills.  No focal weakness.  No dysuria.    All other systems reviewed and negative.   PHYSICAL EXAM: VS:  BP 100/70  Pulse 65  Ht 5\' 5"  (1.651 m)  Wt 181 lb (82.101 kg)  BMI 30.12 kg/m2 Well nourished, well developed, in no acute distress HEENT: normal Neck: no JVD, no carotid bruits Cardiac:  normal S1, S2; RRR;  Lungs:  clear to auscultation bilaterally, no wheezing, rhonchi or rales Abd: soft, nontender, no hepatomegaly Ext: no edema Skin: warm and dry Neuro:   no focal abnormalities noted  EKG:  Normal     ASSESSMENT AND PLAN:  Coronary atherosclerosis of native coronary artery  Continue Aspirin Tablet, 81 mg, 2 tablets, Orally, Daily Continue Nitroglycerin SL tablet, 0.4 mg, as directed Continue Plavix Tablet, 75 MG, 1 tablet, Orally, Once a day Notes: No angina.  3/15 electrolytes stable   2. Old myocardial infarction  Notes: No CHF.    3. Hypercholesteremia, pure  Continue Fish Oil Double Strength Capsule, 1200 MG, 3 capsules, Orally, daily Continue Cholest Off Tablet, 450 MG, 2 tablets, Orally, daily Continue Simvastatin Tablet, 40 MG, 1 tablet every evening, Orally, Once a day  Notes: LDL 138 in 3/14. Significant increase. Simvastatin was increased. LDL improved to < 100. Did not tolerate atorvastatin.  3/15: HDL 33; LDL 75, TG 124 TC 161   4. Thoracic aneurysm without mention of rupture  Notes:  Follow with MR angiogram to evaluate thoracic (ascending) aortic aneurysm. 4 cm ascending aortic aneurysm in 2013. No change since 2008. Will repeat image next year. We went over the sx of an aortic dissection.    Elevated PSA.  Seeing Dr. Annabell Howells in 5/15. Preventive Medicine  Adult topics discussed:  Diet: healthy diet.  Exercise: 5 days a week, at least 30 minutes of aerobic exercise.      Signed, Fredric Mare, MD, [facility] 08/16/2013 10:24 AM

## 2013-08-17 LAB — NMR LIPOPROFILE WITH LIPIDS
CHOLESTEROL, TOTAL: 124 mg/dL (ref <200)
HDL Particle Number: 31.3 umol/L (ref >=30.5)
HDL SIZE: 8.7 nm — AB (ref >=9.2)
HDL-C: 37 mg/dL — AB (ref >=40)
LARGE HDL: 1.8 umol/L — AB (ref >=4.8)
LDL (calc): 72 mg/dL (ref <100)
LDL PARTICLE NUMBER: 1203 nmol/L — AB (ref <1000)
LDL Size: 20.2 nm — ABNORMAL LOW (ref >20.5)
LP-IR Score: 46 — ABNORMAL HIGH (ref <=45)
Small LDL Particle Number: 781 nmol/L — ABNORMAL HIGH (ref <=527)
TRIGLYCERIDES: 74 mg/dL (ref <150)
VLDL SIZE: 43.9 nm (ref <=46.6)

## 2013-08-21 ENCOUNTER — Ambulatory Visit: Payer: 59 | Admitting: Pharmacist

## 2013-08-23 ENCOUNTER — Ambulatory Visit
Admission: RE | Admit: 2013-08-23 | Discharge: 2013-08-23 | Disposition: A | Discharge status: Home / Self Care | Payer: Medicare Other | Source: Ambulatory Visit | Attending: Interventional Cardiology | Admitting: Interventional Cardiology

## 2013-08-23 DIAGNOSIS — I712 Thoracic aortic aneurysm, without rupture, unspecified: Secondary | ICD-10-CM

## 2013-08-23 MED ORDER — GADOBENATE DIMEGLUMINE 529 MG/ML IV SOLN
16.0000 mL | Freq: Once | INTRAVENOUS | Status: AC | PRN
  Administered 2013-08-23: 16 mL via INTRAVENOUS

## 2013-08-30 ENCOUNTER — Telehealth: Payer: Self-pay | Admitting: Cardiology

## 2013-08-30 NOTE — Telephone Encounter (Signed)
Message copied by Theda SersSTEGALL, Taylan Marez H on Thu Aug 30, 2013 10:45 AM ------      Message from: SMART, Gaspar SkeetersJEREMY G      Created: Mon Aug 27, 2013  9:12 AM       RF:  Multi-vessel stents / CAD, AAA, age, low HDL  - LDL goal < 70, LDL-P number < 1000      Meds:  Simvastatin 40 mg qd      Intolerant:  Failed both Crestor and lipitor in the past with muscle aches.      LDL-P number elevated and given his multi-vessel disease, would like to get LDL-P down another 20% if possible.  Can't tolerate more potent statin, so options include combination therapy with either Zetia, Welchol, or PCSK-9 inhibitor trial.        Will have him change to combination vytorin (simvastatin/zetia) as this is typically well tolerated and should be able to get him to goal with only 1 tablet daily.      Plan:      1.  D/C simvastatin 40 mg qd.      2.  Start Vytorin 10/40 mg qhs for potency reasons.      3.  Recheck NMR LipoProfile and hepatic panel 3 months later.      Please notify patient, update meds, and set up labs. Thanks. ------

## 2013-09-11 ENCOUNTER — Other Ambulatory Visit: Payer: Self-pay | Admitting: Cardiology

## 2013-09-11 DIAGNOSIS — E785 Hyperlipidemia, unspecified: Secondary | ICD-10-CM

## 2013-09-21 ENCOUNTER — Other Ambulatory Visit: Payer: Self-pay | Admitting: *Deleted

## 2013-09-21 MED ORDER — CLOPIDOGREL BISULFATE 75 MG PO TABS: 75.0000 mg | ORAL_TABLET | Freq: Every day | ORAL | Status: DC

## 2013-11-08 ENCOUNTER — Telehealth: Payer: Self-pay | Admitting: Interventional Cardiology

## 2013-11-08 MED ORDER — ISOSORBIDE MONONITRATE ER 30 MG PO TB24: 30.0000 mg | ORAL_TABLET | Freq: Every day | ORAL | Status: DC

## 2013-11-08 NOTE — Telephone Encounter (Signed)
Pt notified. Rx sent in. Pt will call back if symptoms do not improve.

## 2013-11-08 NOTE — Telephone Encounter (Signed)
Pt hasnt used nitro because as soon as he stops exerting himself the pain goes away in 10 seconds.

## 2013-11-08 NOTE — Telephone Encounter (Signed)
Pt states when he walks uphill he has tightness/pain in his chest with slight SOB for the the few days. When he walks on level ground he doesn't have any pain/tightness in his chest. Pt denies dizziness and presyncope. Pt is feeling fine today and he just wanted [place] to be aware of his new symptoms.

## 2013-11-08 NOTE — Telephone Encounter (Signed)
New message          Pt is having some heavy exertion and wants to know if he needs to come into the office.

## 2013-11-08 NOTE — Telephone Encounter (Signed)
Can start Imdur 30 mg daily to see if symptoms improve.

## 2013-11-13 ENCOUNTER — Telehealth: Payer: Self-pay

## 2013-11-13 MED ORDER — NITROGLYCERIN 0.4 MG SL SUBL: 0.4000 mg | SUBLINGUAL_TABLET | SUBLINGUAL | Status: DC | PRN

## 2013-11-13 NOTE — Telephone Encounter (Signed)
Refilled

## 2013-12-25 ENCOUNTER — Other Ambulatory Visit: Payer: Self-pay | Admitting: Cardiology

## 2013-12-25 MED ORDER — SIMVASTATIN 40 MG PO TABS: 40.0000 mg | ORAL_TABLET | Freq: Every day | ORAL | Status: DC

## 2013-12-25 MED ORDER — CLOPIDOGREL BISULFATE 75 MG PO TABS: 75.0000 mg | ORAL_TABLET | Freq: Every day | ORAL | Status: DC

## 2013-12-25 MED ORDER — ISOSORBIDE MONONITRATE ER 30 MG PO TB24: 30.0000 mg | ORAL_TABLET | Freq: Every day | ORAL | Status: DC

## 2014-03-06 ENCOUNTER — Other Ambulatory Visit (INDEPENDENT_AMBULATORY_CARE_PROVIDER_SITE_OTHER): Payer: Medicare Other | Admitting: *Deleted

## 2014-03-06 DIAGNOSIS — E785 Hyperlipidemia, unspecified: Secondary | ICD-10-CM

## 2014-03-06 LAB — HEPATIC FUNCTION PANEL
ALT: 9 U/L (ref 0–53)
AST: 17 U/L (ref 0–37)
Albumin: 3.6 g/dL (ref 3.5–5.2)
Alkaline Phosphatase: 50 U/L (ref 39–117)
Bilirubin, Direct: 0 mg/dL (ref 0.0–0.3)
Total Bilirubin: 0.7 mg/dL (ref 0.2–1.2)
Total Protein: 6.8 g/dL (ref 6.0–8.3)

## 2014-03-08 LAB — NMR LIPOPROFILE WITH LIPIDS
Cholesterol, Total: 179 mg/dL (ref 100–199)
HDL Particle Number: 27.8 umol/L — ABNORMAL LOW (ref >=30.5)
HDL SIZE: 9.2 nm (ref >=9.2)
HDL-C: 52 mg/dL (ref >39)
LARGE HDL: 7.8 umol/L (ref >=4.8)
LDL (calc): 95 mg/dL (ref 0–99)
LDL PARTICLE NUMBER: 1260 nmol/L — AB (ref <1000)
LDL SIZE: 21 nm (ref >=20.8)
LP-IR Score: 34 (ref <=45)
Large VLDL-P: 1.6 nmol/L (ref <=2.7)
SMALL LDL PARTICLE NUMBER: 254 nmol/L (ref <=527)
TRIGLYCERIDES: 159 mg/dL — AB (ref 0–149)
VLDL SIZE: 44.7 nm (ref <=46.6)

## 2014-03-30 ENCOUNTER — Other Ambulatory Visit: Payer: Self-pay | Admitting: Interventional Cardiology

## 2014-07-17 ENCOUNTER — Encounter (INDEPENDENT_AMBULATORY_CARE_PROVIDER_SITE_OTHER): Payer: Medicare Other | Admitting: Ophthalmology

## 2014-07-17 DIAGNOSIS — H43813 Vitreous degeneration, bilateral: Secondary | ICD-10-CM | POA: Diagnosis not present

## 2014-07-17 DIAGNOSIS — H31322 Choroidal rupture, left eye: Secondary | ICD-10-CM

## 2014-07-17 DIAGNOSIS — H43821 Vitreomacular adhesion, right eye: Secondary | ICD-10-CM

## 2014-08-12 ENCOUNTER — Other Ambulatory Visit (INDEPENDENT_AMBULATORY_CARE_PROVIDER_SITE_OTHER): Payer: Medicare Other | Admitting: *Deleted

## 2014-08-12 DIAGNOSIS — E7800 Pure hypercholesterolemia, unspecified: Secondary | ICD-10-CM

## 2014-08-12 LAB — LIPID PANEL
CHOLESTEROL: 111 mg/dL (ref 0–200)
HDL: 36.5 mg/dL — ABNORMAL LOW (ref >39.00)
LDL Cholesterol: 57 mg/dL (ref 0–99)
NonHDL: 74.5
TRIGLYCERIDES: 86 mg/dL (ref 0.0–149.0)
Total CHOL/HDL Ratio: 3
VLDL: 17.2 mg/dL (ref 0.0–40.0)

## 2014-08-12 LAB — BASIC METABOLIC PANEL
BUN: 16 mg/dL (ref 6–23)
CO2: 28 meq/L (ref 19–32)
Calcium: 9.2 mg/dL (ref 8.4–10.5)
Chloride: 107 mEq/L (ref 96–112)
Creatinine, Ser: 0.94 mg/dL (ref 0.40–1.50)
GFR: 85.32 mL/min (ref >60.00)
GLUCOSE: 87 mg/dL (ref 70–99)
Potassium: 4.2 mEq/L (ref 3.5–5.1)
SODIUM: 139 meq/L (ref 135–145)

## 2014-08-12 LAB — HEPATIC FUNCTION PANEL
ALBUMIN: 4.2 g/dL (ref 3.5–5.2)
ALT: 8 U/L (ref 0–53)
AST: 20 U/L (ref 0–37)
Alkaline Phosphatase: 66 U/L (ref 39–117)
BILIRUBIN DIRECT: 0.1 mg/dL (ref 0.0–0.3)
TOTAL PROTEIN: 6.3 g/dL (ref 6.0–8.3)
Total Bilirubin: 0.6 mg/dL (ref 0.2–1.2)

## 2014-08-12 NOTE — Addendum Note (Signed)
Addended by: Tonita PhoenixBOWDEN, ROBIN K on: 08/12/2014 10:04 AM   Modules accepted: Orders

## 2014-08-12 NOTE — Addendum Note (Signed)
Addended by: Tate Jerkins K on: 08/12/2014 10:04 AM   Modules accepted: Orders  

## 2014-08-19 ENCOUNTER — Ambulatory Visit: Payer: Medicare Other | Admitting: Interventional Cardiology

## 2014-09-04 ENCOUNTER — Encounter: Payer: Self-pay | Admitting: Interventional Cardiology

## 2014-09-04 ENCOUNTER — Ambulatory Visit (INDEPENDENT_AMBULATORY_CARE_PROVIDER_SITE_OTHER): Payer: Medicare Other | Admitting: Interventional Cardiology

## 2014-09-04 VITALS — BP 124/72 | HR 57 | Ht 65.0 in | Wt 178.0 lb

## 2014-09-04 DIAGNOSIS — R079 Chest pain, unspecified: Secondary | ICD-10-CM | POA: Insufficient documentation

## 2014-09-04 DIAGNOSIS — E7800 Pure hypercholesterolemia, unspecified: Secondary | ICD-10-CM

## 2014-09-04 DIAGNOSIS — I2510 Atherosclerotic heart disease of native coronary artery without angina pectoris: Secondary | ICD-10-CM

## 2014-09-04 DIAGNOSIS — I712 Thoracic aortic aneurysm, without rupture, unspecified: Secondary | ICD-10-CM

## 2014-09-04 DIAGNOSIS — I252 Old myocardial infarction: Secondary | ICD-10-CM | POA: Diagnosis not present

## 2014-09-04 DIAGNOSIS — R0782 Intercostal pain: Secondary | ICD-10-CM

## 2014-09-04 MED ORDER — SIMVASTATIN 40 MG PO TABS: ORAL_TABLET | ORAL | Status: DC

## 2014-09-04 MED ORDER — ISOSORBIDE MONONITRATE ER 30 MG PO TB24: 30.0000 mg | ORAL_TABLET | Freq: Every day | ORAL | Status: DC

## 2014-09-04 MED ORDER — CLOPIDOGREL BISULFATE 75 MG PO TABS: 75.0000 mg | ORAL_TABLET | Freq: Every day | ORAL | Status: DC

## 2014-09-04 NOTE — Progress Notes (Signed)
Patient ID: Lincoln MaxinMikel A Banas, male   DOB: 02-11-1951, 64 y.o.   MRN: [id number]    [address] [place], [place]  [address] Phone: [phone](336) [phone] Fax:  [phone](336) [phone]  Date:  09/04/2014   ID:  Lincoln MaxinMikel A Shenker, DOB 02-11-1951, MRN [id number]  PCP:  Rozanna BoxBABAOFF, MARC E, MD      History of Present Illness: Lincoln MaxinMikel A Mane is a 64 y.o. male with multivessel stents, LAD and RCA in 2008. He has some joint pains and lightheadedness when he is tired. No syncope. He had a hydrocele opration in 2014 and tolerated this well. CAD/ASCVD:  Meredeth IdeWalks , pushups for exercise. Joint pain limits him somewhat but better now. Walks 30 minutes 4 times / week.  Some stiffness if he does not move for a long time.  C/o continued Dizziness while getting up from sitting position, mild, lasting for seconds.   Not bad enough that he was going to fall.  Rare  Chest pain. Shortlived, nonexertional.  Gone before he could get to a nitro.  Different than pain he had prior to last stents.  Denies : Leg edema.  Nitroglycerin use.  Orthopnea.  Palpitations.  Paroxysmal nocturnal dyspnea.  Syncope.     Wt Readings from Last 3 Encounters:  09/04/14 178 lb (80.74 kg)  08/16/13 181 lb (82.101 kg)  06/20/12 182 lb 8 oz (82.781 kg)     Past Medical History  Diagnosis Date  . Coronary artery disease 2008  . Thoracic aneurysm March 2013    4 cm without complicating features  . Hypercholesteremia   . Myocardial infarction     Current Outpatient Prescriptions  Medication Sig Dispense Refill  . aspirin 81 MG tablet Take 81 mg by mouth daily.    . clopidogrel (PLAVIX) 75 MG tablet Take 1 tablet (75 mg total) by mouth daily. 90 tablet 2  . Coenzyme Q10 (CO Q-10) 300 MG CAPS Take 300 mg by mouth daily.    . Glucosamine-Chondroit-Vit C-Mn (GLUCOSAMINE CHONDR 1500 COMPLX PO) Take by mouth daily.    . isosorbide mononitrate (IMDUR) 30 MG 24 hr tablet Take 1 tablet (30 mg total) by mouth daily. 90 tablet 2  . Multiple  Vitamins-Minerals (CENTRUM PO) Take by mouth.    . nitroGLYCERIN (NITROSTAT) 0.4 MG SL tablet Place 1 tablet (0.4 mg total) under the tongue every 5 (five) minutes as needed for chest pain. 25 tablet 5  . Omega-3 Fatty Acids (FISH OIL) 1200 MG CAPS Take by mouth 3 (three) times daily.    . Plant Sterols and Stanols (CHOLESTOFF) 450 MG TABS Take 450 mg by mouth 2 (two) times daily.    . simvastatin (ZOCOR) 40 MG tablet Take 1 tablet by mouth at  bedtime 90 tablet 1   No current facility-administered medications for this visit.    Allergies:    Allergies  Allergen Reactions  . Demerol [Meperidine] Other (See Comments)    Cold sweats  . Antihistamines, Chlorpheniramine-Type Rash       . Sudafed [Pseudoephedrine Hcl] Rash    Social History:  The patient  reports that he has never smoked. He does not have any smokeless tobacco history on file. He reports that he does not drink alcohol.   Family History:  The patient's family history includes Heart Problems in an other family member; Heart attack in his brother and father.   ROS:  Please see the history of present illness.  No nausea, vomiting.  No fevers, chills.  No focal weakness.  No dysuria.    All other systems reviewed and negative.   PHYSICAL EXAM: VS:  BP 124/72 mmHg  Pulse 57  Ht  (1.651 m)  Wt 178 lb (80.74 kg)  BMI 29.62 kg/m2 Well nourished, well developed, in no acute distress HEENT: normal Neck: no JVD, no carotid bruits Cardiac:  normal S1, S2; RRR;  Lungs:  clear to auscultation bilaterally, no wheezing, rhonchi or rales Abd: soft, nontender, no hepatomegaly Ext: no edema Skin: warm and dry Neuro:   no focal abnormalities noted Psych: normal affect  EKG:  Sinus bradycardia, otherwise normal- no significantchange from last year   ASSESSMENT AND PLAN:  Coronary atherosclerosis of native coronary artery  Continue Aspirin Tablet, 81 mg, 1 tablets, Orally, Daily Continue Nitroglycerin SL tablet, 0.4 mg, as  directed Continue Plavix Tablet, 75 MG, 1 tablet, Orally, Once a day Notes: No angina.  3/16 electrolytes stable; rare chest pain that is very atypical and likely musculoskeletal   2. Old myocardial infarction  Notes: No CHF.    3. Hypercholesteremia, pure  Continue Fish Oil Double Strength Capsule, 1200 MG, 3 capsules, Orally, daily Continue Cholest Off Tablet, 450 MG, 2 tablets, Orally, daily Continue Simvastatin Tablet, 40 MG, 1 tablet every evening, Orally, Once a day  Notes: LDL 138 in 3/14. Significant increase. Simvastatin was increased. LDL improved to < 100. Did not tolerate atorvastatin.  3/15: HDL 33; LDL 75, TG 124 TC 409;   LDL in 2016, LDL 57.   4. Thoracic aneurysm without mention of rupture /Aortic atherosclerosis Notes: Follow with MR angiogram to evaluate thoracic (ascending) aortic aneurysm. 4 cm ascending aortic aneurysm in 2013. No change since 2008.  No change in 2015.   Will repeat image next year. We went over the sx of an aortic dissection. Lipid lowering therapy as noted above.   Elevated PSA.  Seeing Dr. Annabell Howells .  PSA improved per his report. Preventive Medicine  Adult topics discussed:  Diet: healthy diet.  Exercise: 5 days a week, at least 30 minutes of aerobic exercise.      Signed, Fredric Mare, MD, [facility] 09/04/2014 2:03 PM

## 2014-09-04 NOTE — Patient Instructions (Signed)
Your physician recommends that you continue on your current medications as directed. Please refer to the Current Medication list given to you today.  Your physician wants you to follow-up in: 1 year with Dr. Varanasi. You will receive a reminder letter in the mail two months in advance. If you don't receive a letter, please call our office to schedule the follow-up appointment.  

## 2014-09-23 ENCOUNTER — Telehealth: Payer: Self-pay | Admitting: *Deleted

## 2014-09-23 NOTE — Telephone Encounter (Signed)
No further cardiac testing needed before cataract surgery. 

## 2014-09-23 NOTE — Telephone Encounter (Signed)
Marcelino DusterMichelle from [facility] calling to see if we received surgical clearance for pt. Pt is having Left eye cataract removal on 09/30/14 and right eye cataract removal on 10/14/14. Informed Marcelino DusterMichelle that I did receive clearance form but that I would need to route this information to Dr. Eldridge DaceVaranasi for advisement on clearance as he will be out of the office until 10/07/14. Marcelino DusterMichelle verbalized understanding.  Will forward to Dr. Eldridge DaceVaranasi for clearance.

## 2014-09-24 NOTE — Telephone Encounter (Signed)
Fax placed in nurse fax box to be sent over to [facility].

## 2014-09-25 ENCOUNTER — Telehealth: Payer: Self-pay | Admitting: Interventional Cardiology

## 2014-09-25 NOTE — Telephone Encounter (Signed)
Spoke with Marcelino DusterMichelle and informed her that we faxed over information yesterday. Marcelino DusterMichelle states that they did not receive paperwork. Informed Marcelino DusterMichelle that I would refax those items. Verified fax number. Marcelino DusterMichelle verbalized understanding and was in agreement with this plan. Refaxed clearance and confirmation sheet obtained that it went through.

## 2014-09-25 NOTE — Telephone Encounter (Signed)
error 

## 2014-09-25 NOTE — Telephone Encounter (Signed)
New problem    Chad DusterMichelle want to know the status of clearance she spoke to you about. Please advise

## 2014-10-16 ENCOUNTER — Encounter (INDEPENDENT_AMBULATORY_CARE_PROVIDER_SITE_OTHER): Payer: Medicare Other | Admitting: Ophthalmology

## 2014-10-23 ENCOUNTER — Encounter (INDEPENDENT_AMBULATORY_CARE_PROVIDER_SITE_OTHER): Payer: Medicare Other | Admitting: Ophthalmology

## 2014-10-23 DIAGNOSIS — H3531 Nonexudative age-related macular degeneration: Secondary | ICD-10-CM

## 2014-10-23 DIAGNOSIS — H43813 Vitreous degeneration, bilateral: Secondary | ICD-10-CM

## 2014-10-23 DIAGNOSIS — H43821 Vitreomacular adhesion, right eye: Secondary | ICD-10-CM

## 2014-10-23 DIAGNOSIS — H31322 Choroidal rupture, left eye: Secondary | ICD-10-CM | POA: Diagnosis not present

## 2015-04-28 ENCOUNTER — Ambulatory Visit (INDEPENDENT_AMBULATORY_CARE_PROVIDER_SITE_OTHER): Payer: Medicare Other | Admitting: Ophthalmology

## 2015-05-05 ENCOUNTER — Ambulatory Visit (INDEPENDENT_AMBULATORY_CARE_PROVIDER_SITE_OTHER): Payer: Medicare Other | Admitting: Ophthalmology

## 2015-06-19 ENCOUNTER — Other Ambulatory Visit: Payer: Self-pay | Admitting: Interventional Cardiology

## 2015-06-19 NOTE — Telephone Encounter (Signed)
simvastatin (ZOCOR) 40 MG tablet  Medication   Date: 09/04/2014  Department: [facility] [address]  Ordering/Authorizing: Corky Crafts, MD      Order Providers    Prescribing Provider Encounter Provider   Corky Crafts, MD Corky Crafts, MD    Medication Detail      Disp Refills Start End     simvastatin (ZOCOR) 40 MG tablet 90 tablet 3 09/04/2014     Sig: Take 1 tablet by mouth at bedtime    E-Prescribing Status: Receipt confirmed by pharmacy (09/04/2014 2:23 PM EDT)     Pharmacy    [facility] - [place], [place] - [address]   isosorbide mononitrate (IMDUR) 30 MG 24 hr tablet  Medication   Date: 09/04/2014  Department: [facility] [address]  Ordering/Authorizing: Corky Crafts, MD      Order Providers    Prescribing Provider Encounter Provider   Corky Crafts, MD Corky Crafts, MD    Medication Detail      Disp Refills Start End     isosorbide mononitrate (IMDUR) 30 MG 24 hr tablet 90 tablet 3 09/04/2014     Sig - Route: Take 1 tablet (30 mg total) by mouth daily. - Oral    E-Prescribing Status: Receipt confirmed by pharmacy (09/04/2014 2:23 PM EDT)     Pharmacy    [facility] - [place], [place] - [address]   clopidogrel (PLAVIX) 75 MG tablet  Medication   Date: 09/04/2014  Department: [facility] [address]  Ordering/Authorizing: Corky Crafts, MD      Order Providers    Prescribing Provider Encounter Provider   Corky Crafts, MD Corky Crafts, MD    Medication Detail      Disp Refills Start End     clopidogrel (PLAVIX) 75 MG tablet 90 tablet 3 09/04/2014     Sig - Route: Take 1 tablet (75 mg total) by mouth daily. - Oral    E-Prescribing Status: Receipt confirmed by pharmacy (09/04/2014 2:23 PM EDT)     Pharmacy    [facility] - [place],  - [address]

## 2015-08-07 ENCOUNTER — Other Ambulatory Visit: Payer: Self-pay | Admitting: Interventional Cardiology

## 2015-09-26 NOTE — Telephone Encounter (Signed)
**Note De-Identified Shantel Wesely Obfuscation** This message has been manually faxed to Dr Dulce Sellarutlaw at [phone]. I did receive confirmation that the fax wet through successfully.

## 2015-09-26 NOTE — Telephone Encounter (Signed)
OK to hold Plavix 5 days prior to endoscopy. 

## 2015-09-26 NOTE — Telephone Encounter (Signed)
The pt takes Plavix 75 mg daily and they are requesting instructions on when the pt should stop taking prior to Endoscopy on 09/30/15. Please advise.

## 2015-10-22 ENCOUNTER — Ambulatory Visit (INDEPENDENT_AMBULATORY_CARE_PROVIDER_SITE_OTHER): Payer: Medicare Other | Admitting: Interventional Cardiology

## 2015-10-22 ENCOUNTER — Encounter: Payer: Self-pay | Admitting: Interventional Cardiology

## 2015-10-22 VITALS — BP 100/80 | HR 60 | Ht 65.0 in | Wt 181.0 lb

## 2015-10-22 DIAGNOSIS — I712 Thoracic aortic aneurysm, without rupture, unspecified: Secondary | ICD-10-CM

## 2015-10-22 DIAGNOSIS — I252 Old myocardial infarction: Secondary | ICD-10-CM

## 2015-10-22 DIAGNOSIS — I2510 Atherosclerotic heart disease of native coronary artery without angina pectoris: Secondary | ICD-10-CM | POA: Diagnosis not present

## 2015-10-22 DIAGNOSIS — E7800 Pure hypercholesterolemia, unspecified: Secondary | ICD-10-CM

## 2015-10-22 MED ORDER — NITROGLYCERIN 0.4 MG SL SUBL: 0.4000 mg | SUBLINGUAL_TABLET | SUBLINGUAL | Status: AC | PRN

## 2015-10-22 MED ORDER — SIMVASTATIN 40 MG PO TABS: ORAL_TABLET | ORAL | Status: DC

## 2015-10-22 MED ORDER — ISOSORBIDE MONONITRATE ER 30 MG PO TB24: ORAL_TABLET | ORAL | Status: DC

## 2015-10-22 MED ORDER — CLOPIDOGREL BISULFATE 75 MG PO TABS: ORAL_TABLET | ORAL | Status: DC

## 2015-10-22 NOTE — Patient Instructions (Signed)
**Note De-identified Mariella Blackwelder Obfuscation** Medication Instructions:  Same-no changes  Labwork: None  Testing/Procedures: None  Follow-Up: Your physician wants you to follow-up in: 1 year. You will receive a reminder letter in the mail two months in advance. If you don't receive a letter, please call our office to schedule the follow-up appointment.      If you need a refill on your cardiac medications before your next appointment, please call your pharmacy.   

## 2015-10-22 NOTE — Progress Notes (Signed)
Cardiology Office Note   Date:  10/22/2015   ID:  Chad Savage, DOB 11-10-1950, MRN [id number]  PCP:  Rozanna BoxBABAOFF, MARC E, MD    No chief complaint on file. f/u CAD   Wt Readings from Last 3 Encounters:  10/22/15 181 lb (82.101 kg)  09/04/14 178 lb (80.74 kg)  08/16/13 181 lb (82.101 kg)       History of Present Illness: Chad Savage is a 65 y.o. male  with multivessel stents, LAD and RCA in 2008. He has some joint pains and lightheadedness when he is tired. No syncope. He had a hydrocele operation in 2014 and tolerated this well.  He Walks and does pushups for exercise. Joint pain limits him somewhat but better now.  He does exercises for his knees to help reduce discomfort.  Walks 30 minutes 4 times / week. Some stiffness if he does not move for a long time.  The Dizziness  He had while getting up from sitting position, mild, lasting for seconds has resolved.   No Chest pain like he had prior to last stents.   Denies : Leg edema. Nitroglycerin use. Orthopnea. Palpitations. Paroxysmal nocturnal dyspnea. Syncope.   Bruising less these days.     Past Medical History  Diagnosis Date  . Coronary artery disease 2008  . Thoracic aneurysm March 2013    4 cm without complicating features  . Hypercholesteremia   . Myocardial infarction [facility](HCC)     Past Surgical History  Procedure Laterality Date  . Cardiac catheterization  2008  . Coronary angioplasty  2008-August    second stent placed  . Hydrocele excision  06/20/2012    Procedure: HYDROCELECTOMY ADULT;  Surgeon: Anner CreteJohn J Wrenn, MD;  [facility];  Service: Urology;  Laterality: Bilateral;     Current Outpatient Prescriptions  Medication Sig Dispense Refill  . aspirin 81 MG tablet Take 81 mg by mouth daily.    . clopidogrel (PLAVIX) 75 MG tablet Take 1 tablet by mouth  daily 90 tablet 0  . Coenzyme Q10 (CO Q-10) 300 MG CAPS Take 300 mg by mouth daily.    . Glucosamine-Chondroit-Vit C-Mn  (GLUCOSAMINE CHONDR 1500 COMPLX PO) Take 1 tablet by mouth 2 (two) times daily. 1500-1200    . isosorbide mononitrate (IMDUR) 30 MG 24 hr tablet Take 1 tablet by mouth  daily 90 tablet 0  . Multiple Vitamins-Minerals (CENTRUM PO) Take 1 tablet by mouth daily.     . nitroGLYCERIN (NITROSTAT) 0.4 MG SL tablet Place 1 tablet (0.4 mg total) under the tongue every 5 (five) minutes as needed for chest pain. 25 tablet 5  . Omega-3 Fatty Acids (FISH OIL) 1200 MG CAPS Take 1,200 mg by mouth 2 (two) times daily.     . Plant Sterols and Stanols (CHOLESTOFF) 450 MG TABS Take 450 mg by mouth 2 (two) times daily.    . simvastatin (ZOCOR) 40 MG tablet Take 1 tablet by mouth at  bedtime 90 tablet 0   No current facility-administered medications for this visit.    Allergies:   Demerol; Antihistamines, chlorpheniramine-type; and Sudafed    Social History:  The patient  reports that he has never smoked. He does not have any smokeless tobacco history on file. He reports that he does not drink alcohol.   Family History:  The patient's family history includes Heart attack in his brother and father.    ROS:  Please see the history of present illness.  Otherwise, review of systems are positive for joint pain.   All other systems are reviewed and negative.    PHYSICAL EXAM: VS:  BP 100/80 mmHg  Pulse 60  Ht  (1.651 m)  Wt 181 lb (82.101 kg)  BMI 30.12 kg/m2 , BMI Body mass index is 30.12 kg/(m^2). GEN: Well nourished, well developed, in no acute distress HEENT: normal Neck: no JVD, carotid bruits, or masses Cardiac: RRR; no murmurs, rubs, or gallops,no edema  Respiratory:  clear to auscultation bilaterally, normal work of breathing GI: soft, nontender, nondistended, + BS MS: no deformity or atrophy Skin: warm and dry, no rash Neuro:  Strength and sensation are intact Psych: euthymic mood, full affect   EKG:   The ekg ordered today demonstrates NSR, no ST segment changes   Recent Labs: No  results found for requested labs within last 365 days.   Lipid Panel    Component Value Date/Time   CHOL 111 08/12/2014 1004   CHOL 179 03/06/2014 0756   TRIG 86.0 08/12/2014 1004   TRIG 159* 03/06/2014 0756   HDL 36.50* 08/12/2014 1004   HDL 52 03/06/2014 0756   CHOLHDL 3 08/12/2014 1004   VLDL 17.2 08/12/2014 1004   LDLCALC 57 08/12/2014 1004   LDLCALC 95 03/06/2014 0756     Other studies Reviewed: Additional studies/ records that were reviewed today with results demonstrating: cath records from 2008.   ASSESSMENT AND PLAN:  1. CAD: No angina.  Continue aggressive secondary prevention. COntinue regular exercise.   Stop aspirin.  Continue clopidogrel.  2. 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   3. Hyperlipidemia: LDL well controlled.  COntinue simvastatin.  4. THoracic aneurysm: 40 mm in 2015- unchanged since 2008.  5. Elevated PSA: Followed by PMD; released by urology.     Current medicines are reviewed at length with the patient today.  The patient concerns regarding his medicines were addressed.  The following changes have been made:  Stop aspirin  Labs/ tests ordered today include:   Orders Placed This Encounter  Procedures  . EKG 12-Lead    Recommend 150 minutes/week of aerobic exercise Low fat, low carb, high fiber diet recommended  Disposition:   FU in 1 year   Signed, Lance Muss, MD  10/22/2015 12:30 PM    [facility] [address] [place], [place], [place]  [address] Phone: [phone]; Fax: [phone]

## 2016-10-05 ENCOUNTER — Encounter: Payer: Self-pay | Admitting: Interventional Cardiology

## 2016-10-13 ENCOUNTER — Telehealth: Payer: Self-pay

## 2016-10-13 NOTE — Telephone Encounter (Signed)
Request for procedure clearance:  1. What type of procedure is being performed? Colonoscopy   2. When is this procedure scheduled? 11/19/16   3. Are there any medications that need to be held prior to procedure and how long? ASA and Plavix   4. Name of physician performing procedure? Dr. Dulce Sellarutlaw   5. What is your office phone and fax number? P- [phone], F- [phone]

## 2016-10-14 NOTE — Telephone Encounter (Signed)
Clearance faxed to Dr. Hulen Shoutsutlaw's office. Confirmation received that the fax went through.

## 2016-10-14 NOTE — Telephone Encounter (Signed)
No further cardiac testing needed before colonoscopy.  OK to hold aspirin and plavix for 5 days prior to test.

## 2016-10-20 NOTE — Progress Notes (Signed)
Cardiology Office Note   Date:  10/21/2016   ID:  Savage, Chad 1950/10/23, MRN [id number]  PCP:  Juluis Rainier, MD    No chief complaint on file. f/u CAD   Wt Readings from Last 3 Encounters:  10/21/16 181 lb 12.8 oz (82.5 kg)  10/22/15 181 lb (82.1 kg)  09/04/14 178 lb (80.7 kg)       History of Present Illness: Chad Savage is a 66 y.o. male  with multivessel stents, LAD and RCA in 2008. He has some joint pains and lightheadedness when he is tired. No syncope. He had a hydrocele operation in 2014 and tolerated this well.  He has continued the same activities. He Walks and does pushups for exercise. Joint pain limits him somewhat but better now.  He does exercises for his knees to help reduce discomfort.  Walks 30 minutes 4 times / week.   No Chest pain like he had prior to last stents.   Denies : Chest pain. Dizziness. Leg edema. Nitroglycerin use. Orthopnea. Palpitations. Paroxysmal nocturnal dyspnea. Shortness of breath. Syncope.   Occasional nosebleed.  No problems with weight gain.  Weight has been stable.      Past Medical History:  Diagnosis Date  . Coronary artery disease 2008  . Hypercholesteremia   . Myocardial infarction (HCC)   . Thoracic aneurysm March 2013   4 cm without complicating features    Past Surgical History:  Procedure Laterality Date  . CARDIAC CATHETERIZATION  2008  . CORONARY ANGIOPLASTY  2008-August   second stent placed  . HYDROCELE EXCISION  06/20/2012   Procedure: HYDROCELECTOMY ADULT;  Surgeon: Anner Crete, MD;  [facility];  Service: Urology;  Laterality: Bilateral;     Current Outpatient Prescriptions  Medication Sig Dispense Refill  . aspirin 81 MG tablet Take 81 mg by mouth daily.    . clopidogrel (PLAVIX) 75 MG tablet Take 1 tablet by mouth  daily 90 tablet 3  . Coenzyme Q10 (CO Q-10) 300 MG CAPS Take 300 mg by mouth daily.    . Glucosamine-Chondroit-Vit C-Mn (GLUCOSAMINE CHONDR  1500 COMPLX PO) Take 1 tablet by mouth 2 (two) times daily. 1500-1200    . isosorbide mononitrate (IMDUR) 30 MG 24 hr tablet Take 1 tablet by mouth  daily 90 tablet 3  . Multiple Vitamins-Minerals (CENTRUM PO) Take 1 tablet by mouth daily.     . nitroGLYCERIN (NITROSTAT) 0.4 MG SL tablet Place 1 tablet (0.4 mg total) under the tongue every 5 (five) minutes as needed for chest pain. 25 tablet 3  . Omega-3 Fatty Acids (FISH OIL) 1200 MG CAPS Take 1,200 mg by mouth 2 (two) times daily.     . Plant Sterols and Stanols (CHOLESTOFF) 450 MG TABS Take 450 mg by mouth 2 (two) times daily.    . simvastatin (ZOCOR) 40 MG tablet Take 1 tablet by mouth at  bedtime 90 tablet 3   No current facility-administered medications for this visit.     Allergies:   Demerol [meperidine]; Antihistamines, chlorpheniramine-type; and Sudafed [pseudoephedrine hcl]    Social History:  The patient  reports that he has never smoked. He has never used smokeless tobacco. He reports that he does not drink alcohol or use drugs. 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  Family History:  The patient's family history includes Heart attack in his brother and father.    ROS:  Please see the history of present illness.   Otherwise, review of systems are positive for occasional bruising.   All other systems are reviewed and negative.    PHYSICAL EXAM: VS:  BP 110/70 (BP Location: Right Arm, Patient Position: Sitting, Cuff Size: Normal)   Pulse 60   Ht 5\' 5"  (1.651 m)   Wt 181 lb 12.8 oz (82.5 kg)   SpO2 95%   BMI 30.25 kg/m  , BMI Body mass index is 30.25 kg/m. GEN: Well nourished, well developed, in no acute distress  HEENT: normal  Neck: no JVD, carotid bruits, or masses Cardiac: RRR; no murmurs, rubs, or gallops,no edema  Respiratory:  clear to auscultation bilaterally, normal work of breathing GI: soft, nontender, nondistended,  MS: no deformity or atrophy  Skin: warm and dry, no  rash Neuro:  Strength and sensation are intact Psych: euthymic mood, full affect     EKG:   The ekg ordered today demonstrates NSR, no ST changes.  No significant change from prior.    Recent Labs: No results found for requested labs within last 8760 hours.   Lipid Panel    Component Value Date/Time   CHOL 111 08/12/2014 1004   CHOL 179 03/06/2014 0756   TRIG 86.0 08/12/2014 1004   TRIG 159 (H) 03/06/2014 0756   HDL 36.50 (L) 08/12/2014 1004   HDL 52 03/06/2014 0756   CHOLHDL 3 08/12/2014 1004   VLDL 17.2 08/12/2014 1004   LDLCALC 57 08/12/2014 1004   LDLCALC 95 03/06/2014 0756     Other studies Reviewed: Additional studies/ records that were reviewed today with results demonstrating: cath records from 2008.  2015 MRI reviewed   ASSESSMENT AND PLAN:  1. CAD: No angina on medical therapy.  Continue secondary prevention and clopidogrel.  Continue regular exercise.  2. Old MI: No heart failure.  No beta blocker due to prior bradycardia.  3. Hyperlipidemia:   LDL 71 at last check, HDL 42.  Checked with Dr. Zachery DauerBarnes in the past year.    4. THoracic aneurysm: Last checked in 2015 by MRI.  40 mm , no change since 2008.  Will hold off on imaging at this time.  5. Elevated PSA: Has been stable.  Improved when he stopped Zyrtec.  Still checked regularly with PMD.   Current medicines are reviewed at length with the patient today.  The patient concerns regarding his medicines were addressed.  The following changes have been made:    Labs/ tests ordered today include:   No orders of the defined types were placed in this encounter.   Recommend 150 minutes/week of aerobic exercise Low fat, low carb, high fiber diet recommended  Disposition:   FU in 1 year   Signed, Lance MussJayadeep Bret Vanessen, MD  10/21/2016 10:30 AM    [facility] [address], [place], [place]  [address] Phone: [phone]([phone]; Fax: [phone]

## 2016-10-21 ENCOUNTER — Encounter: Payer: Self-pay | Admitting: Interventional Cardiology

## 2016-10-21 ENCOUNTER — Ambulatory Visit (INDEPENDENT_AMBULATORY_CARE_PROVIDER_SITE_OTHER): Payer: Medicare Other | Admitting: Interventional Cardiology

## 2016-10-21 VITALS — BP 110/70 | HR 60 | Ht 65.0 in | Wt 181.8 lb

## 2016-10-21 DIAGNOSIS — I25119 Atherosclerotic heart disease of native coronary artery with unspecified angina pectoris: Secondary | ICD-10-CM

## 2016-10-21 DIAGNOSIS — I252 Old myocardial infarction: Secondary | ICD-10-CM | POA: Diagnosis not present

## 2016-10-21 DIAGNOSIS — I712 Thoracic aortic aneurysm, without rupture, unspecified: Secondary | ICD-10-CM

## 2016-10-21 DIAGNOSIS — E7800 Pure hypercholesterolemia, unspecified: Secondary | ICD-10-CM

## 2016-10-21 DIAGNOSIS — R972 Elevated prostate specific antigen [PSA]: Secondary | ICD-10-CM

## 2016-10-21 MED ORDER — CLOPIDOGREL BISULFATE 75 MG PO TABS: ORAL_TABLET | ORAL | 3 refills | Status: DC

## 2016-10-21 MED ORDER — SIMVASTATIN 40 MG PO TABS: ORAL_TABLET | ORAL | 3 refills | Status: DC

## 2016-10-21 MED ORDER — ISOSORBIDE MONONITRATE ER 30 MG PO TB24: ORAL_TABLET | ORAL | 3 refills | Status: DC

## 2016-10-21 NOTE — Patient Instructions (Signed)

## 2017-10-24 ENCOUNTER — Other Ambulatory Visit: Payer: Self-pay | Admitting: Interventional Cardiology

## 2017-10-31 NOTE — Progress Notes (Signed)
Cardiology Office Note   Date:  11/01/2017   ID:  Chad Savage, DOB 05/24/1951, MRN [id number]  PCP:  Chad Savage, Elizabeth, MD    No chief complaint on file.  CAD  Wt Readings from Last 3 Encounters:  11/01/17 178 lb (80.7 kg)  10/21/16 181 lb 12.8 oz (82.5 kg)  10/22/15 181 lb (82.1 kg)       History of Present Illness: Chad Savage is a 67 y.o. male  with multivessel stents, LAD and RCA in 2008. He has some joint pains and lightheadedness when he is tired. No syncope. He had a hydrocele operation in 2014 and tolerated this well.  He has continued the same activities. He still walks and does pushups for exercise. Joint pain limits him somewhat but better now.  He does exercises for his knees to help reduce discomfort.  Walks 30 minutes 4 times / week.   He is eligible for Silver Sneakers.   Denies : Chest pain. Dizziness. Leg edema. Nitroglycerin use. Orthopnea. Palpitations. Paroxysmal nocturnal dyspnea. Shortness of breath. Syncope.   No bleeding problems.   He had forgotten his meds for three days when he travelled.  He had no angina despite being off of Imdur for those few days.  He would like to stop the Imdur since he did well without it.     Past Medical History:  Diagnosis Date  . Coronary artery disease 2008  . Hypercholesteremia   . Myocardial infarction (HCC)   . Thoracic aneurysm March 2013   4 cm without complicating features    Past Surgical History:  Procedure Laterality Date  . CARDIAC CATHETERIZATION  2008  . CORONARY ANGIOPLASTY  2008-August   second stent placed  . HYDROCELE EXCISION  06/20/2012   Procedure: HYDROCELECTOMY ADULT;  Surgeon: Chad CreteJohn J Wrenn, MD;  [facility];  Service: Urology;  Laterality: Bilateral;     Current Outpatient Medications  Medication Sig Dispense Refill  . aspirin 81 MG tablet Take 81 mg by mouth daily.    . clopidogrel (PLAVIX) 75 MG tablet Take 1 tablet by mouth  Daily. Please keep  upcoming appt for future refills. Thank you 90 tablet 0  . Coenzyme Q10 (CO Q-10) 300 MG CAPS Take 300 mg by mouth daily.    . Glucosamine-Chondroit-Vit C-Mn (GLUCOSAMINE CHONDR 1500 COMPLX PO) Take 1 tablet by mouth 2 (two) times daily. 1500-1200    . isosorbide mononitrate (IMDUR) 30 MG 24 hr tablet Take 1 tablet by mouth  Daily. Please keep upcoming appt for future refills. Thank you 90 tablet 0  . Multiple Vitamins-Minerals (CENTRUM PO) Take 1 tablet by mouth daily.     . nitroGLYCERIN (NITROSTAT) 0.4 MG SL tablet Place 1 tablet (0.4 mg total) under the tongue every 5 (five) minutes as needed for chest pain. 25 tablet 3  . Omega-3 Fatty Acids (FISH OIL) 1200 MG CAPS Take 1,200 mg by mouth 2 (two) times daily.     . simvastatin (ZOCOR) 40 MG tablet Take 1 tablet by mouth at  Bedtime. Please keep upcoming appt for future refills. Thank you 90 tablet 0   No current facility-administered medications for this visit.     Allergies:   Meperidine; Pseudoephedrine hcl; Antihistamines, chlorpheniramine-type; and Sudafed [pseudoephedrine hcl]    Social History:  The patient  reports that he has never smoked. He has never used smokeless tobacco. He reports that he does not drink alcohol or use drugs.   Family History:  The patient's family history includes Heart Problems in his unknown relative; Heart attack in his brother and father.    ROS:  Please see the history of present illness.   Otherwise, review of systems are positive for recent travel, without issues.   All other systems are reviewed and negative.    PHYSICAL EXAM: VS:  BP 130/88   Pulse (!) 58   Ht 5\' 5"  (1.651 m)   Wt 178 lb (80.7 kg)   SpO2 93%   BMI 29.62 kg/m  , BMI Body mass index is 29.62 kg/m. GEN: Well nourished, well developed, in no acute distress  HEENT: normal  Neck: no JVD, carotid bruits, or masses Cardiac: RRR; no murmurs, rubs, or gallops,no edema  Respiratory:  clear to auscultation bilaterally, normal work  of breathing GI: soft, nontender, nondistended, + BS MS: no deformity or atrophy  Skin: warm and dry, no rash Neuro:  Strength and sensation are intact Psych: euthymic mood, full affect   EKG:   The ekg ordered today demonstrates NSR, no ST changes   Recent Labs: No results found for requested labs within last 8760 hours.   Lipid Panel    Component Value Date/Time   CHOL 111 08/12/2014 1004   CHOL 179 03/06/2014 0756   TRIG 86.0 08/12/2014 1004   TRIG 159 (H) 03/06/2014 0756   HDL 36.50 (L) 08/12/2014 1004   HDL 52 03/06/2014 0756   CHOLHDL 3 08/12/2014 1004   VLDL 17.2 08/12/2014 1004   LDLCALC 57 08/12/2014 1004   LDLCALC 95 03/06/2014 0756     Other studies Reviewed: Additional studies/ records that were reviewed today with results demonstrating: 2008 CT, 2015 MRA reviewed .   ASSESSMENT AND PLAN:  1. CAD: Stop Imdur.  No angina currently on medical therapy.  He felt ok off of his meds. No bleding problems.   2. Old MI: No CHF sx. COntinue aggressive secondary prevention.  3. Hyperlipidemia: LDL 85. Continue lipid lowering therapy.  4. Thoracic aneurysm:  WIll check MRA to eval thoracic aneurysm.  Small as of 2015 MRA.    Current medicines are reviewed at length with the patient today.  The patient concerns regarding his medicines were addressed.  The following changes have been made:  Stop Imdur  Labs/ tests ordered today include:  No orders of the defined types were placed in this encounter.   Recommend 150 minutes/week of aerobic exercise Low fat, low carb, high fiber diet recommended  Disposition:   FU in 1 year   Signed, Chad Muss, MD  11/01/2017 11:35 AM    [facility] [address] [place], [place], [place]  [address] Phone: [phone]; Fax: [phone]

## 2017-11-01 ENCOUNTER — Encounter: Payer: Self-pay | Admitting: Interventional Cardiology

## 2017-11-01 ENCOUNTER — Ambulatory Visit: Payer: Medicare Other | Admitting: Interventional Cardiology

## 2017-11-01 VITALS — BP 130/88 | HR 58 | Ht 65.0 in | Wt 178.0 lb

## 2017-11-01 DIAGNOSIS — I712 Thoracic aortic aneurysm, without rupture, unspecified: Secondary | ICD-10-CM

## 2017-11-01 DIAGNOSIS — E7800 Pure hypercholesterolemia, unspecified: Secondary | ICD-10-CM | POA: Diagnosis not present

## 2017-11-01 DIAGNOSIS — I252 Old myocardial infarction: Secondary | ICD-10-CM | POA: Diagnosis not present

## 2017-11-01 DIAGNOSIS — I25119 Atherosclerotic heart disease of native coronary artery with unspecified angina pectoris: Secondary | ICD-10-CM | POA: Diagnosis not present

## 2017-11-01 LAB — BASIC METABOLIC PANEL
BUN/Creatinine Ratio: 14 (ref 10–24)
BUN: 12 mg/dL (ref 8–27)
CO2: 24 mmol/L (ref 20–29)
Calcium: 9.2 mg/dL (ref 8.6–10.2)
Chloride: 101 mmol/L (ref 96–106)
Creatinine, Ser: 0.87 mg/dL (ref 0.76–1.27)
GFR calc Af Amer: 102 mL/min/{1.73_m2} (ref >59)
GFR calc non Af Amer: 88 mL/min/{1.73_m2} (ref >59)
Glucose: 95 mg/dL (ref 65–99)
Potassium: 4.3 mmol/L (ref 3.5–5.2)
Sodium: 138 mmol/L (ref 134–144)

## 2017-11-01 MED ORDER — CLOPIDOGREL BISULFATE 75 MG PO TABS: ORAL_TABLET | ORAL | 3 refills | Status: DC

## 2017-11-01 MED ORDER — SIMVASTATIN 40 MG PO TABS: ORAL_TABLET | ORAL | 3 refills | Status: DC

## 2017-11-01 NOTE — Patient Instructions (Addendum)
Medication Instructions:  Your physician has recommended you make the following change in your medication:   STOP: isosorbide mononitrate (imdur)  Labwork: TODAY: BMET  Testing/Procedures: Your physician has requested that you have a MRI of the chest to evaluate your thoracic aortic anuerysm.   Follow-Up: Your physician wants you to follow-up in: 1 year with Dr. Eldridge DaceVaranasi. You will receive a reminder letter in the mail two months in advance. If you don't receive a letter, please call our office to schedule the follow-up appointment.   Any Other Special Instructions Will Be Listed Below (If Applicable).     If you need a refill on your cardiac medications before your next appointment, please call your pharmacy.

## 2017-11-11 ENCOUNTER — Encounter (HOSPITAL_COMMUNITY): Payer: Self-pay

## 2017-11-11 ENCOUNTER — Ambulatory Visit (HOSPITAL_COMMUNITY): Payer: Medicare Other

## 2017-12-15 ENCOUNTER — Telehealth: Payer: Self-pay | Admitting: Interventional Cardiology

## 2017-12-15 NOTE — Telephone Encounter (Signed)
New Message       [facility] Pre-operative Risk Assessment    Request for surgical clearance:  1. What type of surgery is being performed? Bridge and one tooth extraction  2. When is this surgery scheduled? 12/19/2017    3. What type of clearance is required (medical clearance vs. Pharmacy clearance to hold med vs. Both)? Pharmacy   4. Are there any medications that need to be held prior to surgery and how long? Hold Plavix and aspirin  5. Practice name and name of physician performing surgery? Dr. Towana Badger DDS   6. What is your office phone number [phone]    7.   What is your office fax number [phone]  8.   Anesthesia type (None, local, MAC, general) ? Lidocaine    Chad Savage 12/15/2017, 11:08 AM  _________________________________________________________________   (provider comments below)

## 2017-12-15 NOTE — Telephone Encounter (Signed)
OK to hold aspirin and plavix for 5 days prior to tooth extraction.

## 2017-12-15 NOTE — Telephone Encounter (Signed)
Dr. Eldridge DaceVaranasi can pt be off plavix and ASA for tooth extraction?

## 2017-12-16 NOTE — Telephone Encounter (Signed)
See Dr. Varanasi's note  

## 2018-10-30 ENCOUNTER — Telehealth: Payer: Self-pay

## 2018-10-30 NOTE — Telephone Encounter (Signed)
Virtual Visit Pre-Appointment Phone Call  "(Name), I am calling you today to discuss your upcoming appointment. We are currently trying to limit exposure to the virus that causes COVID-19 by seeing patients at home rather than in the office."  1. "What is the BEST phone number to call the day of the visit?" - include this in appointment notes  2. "Do you have or have access to (through a family member/friend) a smartphone with video capability that we can use for your visit?" a. If yes - list this number in appt notes as "cell" (if different from BEST phone #) and list the appointment type as a VIDEO visit in appointment notes b. If no - list the appointment type as a PHONE visit in appointment notes  3. Confirm consent - "In the setting of the current Covid19 crisis, you are scheduled for a (phone or video) visit with your provider on (date) at (time).  Just as we do with many in-office visits, in order for you to participate in this visit, we must obtain consent.  If you'd like, I can send this to your mychart (if signed up) or email for you to review.  Otherwise, I can obtain your verbal consent now.  All virtual visits are billed to your insurance company just like a normal visit would be.  By agreeing to a virtual visit, we'd like you to understand that the technology does not allow for your provider to perform an examination, and thus may limit your provider's ability to fully assess your condition. If your provider identifies any concerns that need to be evaluated in person, we will make arrangements to do so.  Finally, though the technology is pretty good, we cannot assure that it will always work on either your or our end, and in the setting of a video visit, we may have to convert it to a phone-only visit.  In either situation, we cannot ensure that we have a secure connection.  Are you willing to proceed?" STAFF: Did the patient verbally acknowledge consent to telehealth visit? Document  YES/NO here: yes  4. Advise patient to be prepared - "Two hours prior to your appointment, go ahead and check your blood pressure, pulse, oxygen saturation, and your weight (if you have the equipment to check those) and write them all down. When your visit starts, your provider will ask you for this information. If you have an Apple Watch or Kardia device, please plan to have heart rate information ready on the day of your appointment. Please have a pen and paper handy nearby the day of the visit as well."  5. Give patient instructions for MyChart download to smartphone OR Doximity/Doxy.me as below if video visit (depending on what platform provider is using)  6. Inform patient they will receive a phone call 15 minutes prior to their appointment time (may be from unknown caller ID) so they should be prepared to answer    TELEPHONE CALL NOTE  Chad Savage has been deemed a candidate for a follow-up tele-health visit to limit community exposure during the Covid-19 pandemic. I spoke with the patient via phone to ensure availability of phone/video source, confirm preferred email & phone number, and discuss instructions and expectations.  I reminded Chad Savage to be prepared with any vital sign and/or heart rhythm information that could potentially be obtained via home monitoring, at the time of his visit. I reminded Chad Savage to expect a phone call prior to  his visit.  Clide DalesDanielle M Nichlas Pitera, CMA 10/30/2018 2:23 PM   INSTRUCTIONS FOR DOWNLOADING THE MYCHART APP TO SMARTPHONE  - The patient must first make sure to have activated MyChart and know their login information - If Apple, go to [employer] Store and type in MyChart in the search bar and download the app. If Android, ask patient to go to [employer] Play Store and type in VayasMyChart in the search bar and download the app. The app is free but as with any other app downloads, their phone may require them to verify saved payment information or Apple/Android  password.  - The patient will need to then log into the app with their MyChart username and password, and select [facility] as their healthcare provider to link the account. When it is time for your visit, go to the MyChart app, find appointments, and click Begin Video Visit. Be sure to Select Allow for your device to access the Microphone and Camera for your visit. You will then be connected, and your provider will be with you shortly.  **If they have any issues connecting, or need assistance please contact MyChart service desk [phone])**  **If using a computer, in order to ensure the best quality for their visit they will need to use either of the following Internet Browsers: [employer] Edge, or Google Chrome**  IF USING DOXIMITY or DOXY.ME - The patient will receive a link just prior to their visit by text.     FULL LENGTH CONSENT FOR TELE-HEALTH VISIT   I hereby voluntarily request, consent and authorize [facility] and its employed or contracted physicians, physician assistants, nurse practitioners or other licensed health care professionals (the Practitioner), to provide me with telemedicine health care services (the "Services") as deemed necessary by the treating Practitioner. I acknowledge and consent to receive the Services by the Practitioner via telemedicine. I understand that the telemedicine visit will involve communicating with the Practitioner through live audiovisual communication technology and the disclosure of certain medical information by electronic transmission. I acknowledge that I have been given the opportunity to request an in-person assessment or other available alternative prior to the telemedicine visit and am voluntarily participating in the telemedicine visit.  I understand that I have the right to withhold or withdraw my consent to the use of telemedicine in the course of my care at any time, without affecting my right to future care or treatment,  and that the Practitioner or I may terminate the telemedicine visit at any time. I understand that I have the right to inspect all information obtained and/or recorded in the course of the telemedicine visit and may receive copies of available information for a reasonable fee.  I understand that some of the potential risks of receiving the Services via telemedicine include:  Marland Kitchen. Delay or interruption in medical evaluation due to technological equipment failure or disruption; . Information transmitted may not be sufficient (e.g. poor resolution of images) to allow for appropriate medical decision making by the Practitioner; and/or  . In rare instances, security protocols could fail, causing a breach of personal health information.  Furthermore, I acknowledge that it is my responsibility to provide information about my medical history, conditions and care that is complete and accurate to the best of my ability. I acknowledge that Practitioner's advice, recommendations, and/or decision may be based on factors not within their control, such as incomplete or inaccurate data provided by me or distortions of diagnostic images or specimens that may result from electronic transmissions.  I understand that the practice of medicine is not an exact science and that Practitioner makes no warranties or guarantees regarding treatment outcomes. I acknowledge that I will receive a copy of this consent concurrently upon execution via email to the email address I last provided but may also request a printed copy by calling the office of [facility].    I understand that my insurance will be billed for this visit.   I have read or had this consent read to me. . I understand the contents of this consent, which adequately explains the benefits and risks of the Services being provided via telemedicine.  . I have been provided ample opportunity to ask questions regarding this consent and the Services and have had my questions  answered to my satisfaction. . I give my informed consent for the services to be provided through the use of telemedicine in my medical care  By participating in this telemedicine visit I agree to the above.

## 2018-11-01 NOTE — Progress Notes (Signed)
Virtual Visit via Video Note   This visit type was conducted due to national recommendations for restrictions regarding the COVID-19 Pandemic (e.g. social distancing) in an effort to limit this patient's exposure and mitigate transmission in our community.  Due to his co-morbid illnesses, this patient is at least at moderate risk for complications without adequate follow up.  This format is felt to be most appropriate for this patient at this time.  All issues noted in this document were discussed and addressed.  A limited physical exam was performed with this format.  Please refer to the patient's chart for his consent to telehealth for [facility].   Date:  11/01/2018   ID:  Lambert, Birts 27-Feb-1951, MRN [id number]  Patient Location: Home Provider Location: Home  PCP:  Juluis Rainier, MD  Cardiologist:  No primary care provider on file. Selassie Spatafore Electrophysiologist:  None   Evaluation Performed:  Follow-Up Visit  Chief Complaint:  CAD, ED  History of Present Illness:    Chad Savage is a 68 y.o. male with with multivessel stents, LAD and RCA in 2008. He has some joint pains and lightheadedness when he is tired. No syncope. He had a hydrocele operation in 2014 and tolerated this well.  He has continued the same activities.He still walks and does pushups for exercise. Joint pain limits him somewhat but better now. He does exercises for his knees to help reduce discomfort. Walks 30 minutes 4 times / week.   He is eligible for Silver Sneakers.   He stopped angina in 2019 because he was not having angina.   The patient does not have symptoms concerning for COVID-19 infection (fever, chills, cough, or new shortness of breath).   Denies : Chest pain. Dizziness. Leg edema. Nitroglycerin use. Orthopnea. Palpitations. Paroxysmal nocturnal dyspnea. Shortness of breath. Syncope.   He walks several times a week.  He is social distancing.  He wears a mask when he goes out.     Past Medical History:  Diagnosis Date  . Coronary artery disease 2008  . Hypercholesteremia   . Myocardial infarction (HCC)   . Thoracic aneurysm March 2013   4 cm without complicating features   Past Surgical History:  Procedure Laterality Date  . CARDIAC CATHETERIZATION  2008  . CORONARY ANGIOPLASTY  2008-August   second stent placed  . HYDROCELE EXCISION  06/20/2012   Procedure: HYDROCELECTOMY ADULT;  Surgeon: Anner Crete, MD;  [facility];  Service: Urology;  Laterality: Bilateral;     No outpatient medications have been marked as taking for the 11/03/18 encounter (Appointment) with Corky Crafts, MD.     Allergies:   Meperidine; Pseudoephedrine hcl; Antihistamines, chlorpheniramine-type; and Sudafed [pseudoephedrine hcl]   Social History   Tobacco Use  . Smoking status: Never Smoker  . Smokeless tobacco: Never Used  Substance Use Topics  . Alcohol use: No  . Drug use: No     Family Hx: The patient's family history includes Heart Problems in his unknown relative; Heart attack in his brother and father.  ROS:   Please see the history of present illness.    Erectile dysfunction All other systems reviewed and are negative.   Prior CV studies:   The following studies were reviewed today:  2015 chest MRI reviewed  Labs/Other Tests and Data Reviewed:    EKG:  An ECG dated 6/19 was personally reviewed today and demonstrated:  NSR, no ST changes  Recent Labs: No results found  for requested labs within last 8760 hours.   Recent Lipid Panel Lab Results  Component Value Date/Time   CHOL 111 08/12/2014 10:04 AM   CHOL 179 03/06/2014 07:56 AM   TRIG 86.0 08/12/2014 10:04 AM   TRIG 159 (H) 03/06/2014 07:56 AM   HDL 36.50 (L) 08/12/2014 10:04 AM   HDL 52 03/06/2014 07:56 AM   CHOLHDL 3 08/12/2014 10:04 AM   LDLCALC 57 08/12/2014 10:04 AM   LDLCALC 95 03/06/2014 07:56 AM    Wt Readings from Last 3 Encounters:  11/01/17 178 lb  (80.7 kg)  10/21/16 181 lb 12.8 oz (82.5 kg)  10/22/15 181 lb (82.1 kg)     Objective:    Vital Signs:  There were no vitals taken for this visit.   VITAL SIGNS:  reviewed GEN:  no acute distress RESPIRATORY:  normal respiratory effort, symmetric expansion PSYCH:  normal affect exam limited due to video format  ASSESSMENT & PLAN:    1. CAD: No angina.  Continue aggressive secondary prevention.  2. Old MI: No CHF sx.  3. Hyperlipidemia: Needs lipids rechecked.  Will plan for labs in August, once things have calmed down with COVID-19. 4. Thoracic aneurysm: 40 mm ascending aorta by MRI in 2015.  He is willing to have another study, in a few months.  5. E.D.: OK to use sildenafil since not using NTG.  Dosing up to 100 mg per episode with 20 mg tabs.   COVID-19 Education: The signs and symptoms of COVID-19 were discussed with the patient and how to seek care for testing (follow up with PCP or arrange E-visit).  The importance of social distancing was discussed today.  Time:   Today, I have spent 20 minutes with the patient with telehealth technology discussing the above problems.     Medication Adjustments/Labs and Tests Ordered: Current medicines are reviewed at length with the patient today.  Concerns regarding medicines are outlined above.   Tests Ordered: No orders of the defined types were placed in this encounter.   Medication Changes: No orders of the defined types were placed in this encounter.   Disposition:  Follow up in 1 year(s)  Signed, Lance MussJayadeep Jaiveer Panas, MD  11/01/2018 4:25 PM    [facility]

## 2018-11-03 ENCOUNTER — Other Ambulatory Visit: Payer: Self-pay

## 2018-11-03 ENCOUNTER — Encounter: Payer: Self-pay | Admitting: Interventional Cardiology

## 2018-11-03 ENCOUNTER — Telehealth (INDEPENDENT_AMBULATORY_CARE_PROVIDER_SITE_OTHER): Payer: Medicare Other | Admitting: Interventional Cardiology

## 2018-11-03 VITALS — BP 119/68 | HR 60 | Ht 65.0 in | Wt 175.0 lb

## 2018-11-03 DIAGNOSIS — I252 Old myocardial infarction: Secondary | ICD-10-CM

## 2018-11-03 DIAGNOSIS — I712 Thoracic aortic aneurysm, without rupture, unspecified: Secondary | ICD-10-CM

## 2018-11-03 DIAGNOSIS — I25119 Atherosclerotic heart disease of native coronary artery with unspecified angina pectoris: Secondary | ICD-10-CM

## 2018-11-03 DIAGNOSIS — E7800 Pure hypercholesterolemia, unspecified: Secondary | ICD-10-CM

## 2018-11-03 MED ORDER — SILDENAFIL CITRATE 20 MG PO TABS: ORAL_TABLET | ORAL | 6 refills | Status: DC

## 2018-11-03 MED ORDER — SIMVASTATIN 40 MG PO TABS: ORAL_TABLET | ORAL | 3 refills | Status: DC

## 2018-11-03 MED ORDER — CLOPIDOGREL BISULFATE 75 MG PO TABS: ORAL_TABLET | ORAL | 3 refills | Status: DC

## 2018-11-03 NOTE — Patient Instructions (Signed)
Medication Instructions:  Your physician recommends that you continue on your current medications as directed. Please refer to the Current Medication list given to you today.  A prescription for sildenafil 20 mg tablet was sent to [facility]. Take 3-5 tablets 1 hour prior to sexual activity  If you need a refill on your cardiac medications before your next appointment, please call your pharmacy.   Lab work: Your physician recommends that you return for a FASTING lipid profile and complete metabolic panel in August.    If you have labs (blood work) drawn today and your tests are completely normal, you will receive your results only by: Marland Kitchen MyChart Message (if you have MyChart) OR . A paper copy in the mail If you have any lab test that is abnormal or we need to change your treatment, we will call you to review the results.  Testing/Procedures: Your physician has requested that you have an MRI of your chest in August to evaluate you thoracic aortic aneurysm.  Follow-Up: At [facility], you and your health needs are our priority.  As part of our continuing mission to provide you with exceptional heart care, we have created designated Provider Care Teams.  These Care Teams include your primary Cardiologist (physician) and Advanced Practice Providers (APPs -  Physician Assistants and Nurse Practitioners) who all work together to provide you with the care you need, when you need it. . You will need a follow up appointment in 1 year.  Please call our office 2 months in advance to schedule this appointment.  You may see Everette Rank, MD or one of the following Advanced Practice Providers on your designated Care Team:   . Robbie Lis, PA-C . Dayna Dunn, PA-C . Jacolyn Reedy, PA-C  Any Other Special Instructions Will Be Listed Below (If Applicable).

## 2019-01-25 ENCOUNTER — Other Ambulatory Visit: Payer: Self-pay

## 2019-01-25 ENCOUNTER — Ambulatory Visit (HOSPITAL_COMMUNITY)
Admission: RE | Admit: 2019-01-25 | Discharge: 2019-01-25 | Disposition: A | Discharge status: Home / Self Care | Payer: Medicare Other | Source: Ambulatory Visit | Attending: Interventional Cardiology | Admitting: Interventional Cardiology

## 2019-01-25 DIAGNOSIS — I712 Thoracic aortic aneurysm, without rupture, unspecified: Secondary | ICD-10-CM

## 2019-01-25 LAB — CREATININE, SERUM
Creatinine, Ser: 1.09 mg/dL (ref 0.61–1.24)
GFR calc Af Amer: >60 mL/min (ref >60)
GFR calc non Af Amer: >60 mL/min (ref >60)

## 2019-01-25 MED ORDER — GADOBUTROL 1 MMOL/ML IV SOLN
8.0000 mL | Freq: Once | INTRAVENOUS | Status: AC | PRN
  Administered 2019-01-25: 8 mL via INTRAVENOUS

## 2019-01-26 ENCOUNTER — Telehealth: Payer: Self-pay | Admitting: Interventional Cardiology

## 2019-01-26 DIAGNOSIS — I712 Thoracic aortic aneurysm, without rupture, unspecified: Secondary | ICD-10-CM

## 2019-01-26 NOTE — Telephone Encounter (Signed)
Returned call to pt re: lab / mri results. Pt has been made aware of stable renal function and the slight increase in his thoracic aneurysm and that per Dr. Irish Lack, we will continue to monitor and repeat the study in 1 year. (order in Epic)  Pt verbalized understanding.

## 2019-01-26 NOTE — Telephone Encounter (Signed)
  Patient is returning call for MRI results

## 2019-11-01 ENCOUNTER — Other Ambulatory Visit: Payer: Self-pay | Admitting: Interventional Cardiology

## 2019-11-02 NOTE — Telephone Encounter (Signed)
Refill sent in per JV. Will discuss change at next visit.

## 2019-11-09 ENCOUNTER — Ambulatory Visit: Payer: Medicare Other | Admitting: Interventional Cardiology

## 2019-11-09 ENCOUNTER — Other Ambulatory Visit: Payer: Self-pay | Admitting: Interventional Cardiology

## 2019-11-10 NOTE — Progress Notes (Signed)
Cardiology Office Note   Date:  11/12/2019   ID:  Giovoni, Bunch 07/18/1950, MRN [id number]  PCP:  Juluis Rainier, MD    No chief complaint on file.  CAD  Wt Readings from Last 3 Encounters:  11/12/19 170 lb 6.4 oz (77.3 kg)  11/03/18 175 lb (79.4 kg)  11/01/17 178 lb (80.7 kg)       History of Present Illness: Branch A Eich is a 69 y.o. male  with multivessel stents, LAD and RCA in 2008. He has some joint pains and lightheadedness when he is tired. No syncope. He had a hydrocele operation in 2014 and tolerated this well.  He has continued the same activities.[place] walks and does pushups for exercise. Joint pain limits him somewhat but better now. He does exercises for his knees to help reduce discomfort.   He stopped some meds in 2019 because he was not having angina.   He had MRA of his thoracic aortic aneurysm in August 2020, and it measured 4.4 cm.    The patient does not have symptoms concerning for COVID-19 infection (fever, chills, cough, or new shortness of breath).   He stayed healthy during COVID.  He did get his vaccines.   Denies : Chest pain. Dizziness. Leg edema. Nitroglycerin use. Orthopnea. Palpitations. Paroxysmal nocturnal dyspnea. Shortness of breath. Syncope.   Continues to exercise, push ups, walking (2-3 days/week).    Rare hot flash feelings in the cold weather when he would bend down.  This has resolved as the weather got warmer. He also decreased one of his supplements (potassium bicarbonate OTC- he believes it improves his pH) which seemed to help reduce the sx.    Past Medical History:  Diagnosis Date  . Coronary artery disease 2008  . Hypercholesteremia   . Myocardial infarction (HCC)   . Thoracic aneurysm March 2013   4 cm without complicating features    Past Surgical History:  Procedure Laterality Date  . CARDIAC CATHETERIZATION  2008  . CORONARY ANGIOPLASTY  2008-August   second stent placed  . HYDROCELE  EXCISION  06/20/2012   Procedure: HYDROCELECTOMY ADULT;  Surgeon: Anner Crete, MD;  Location: [facility];  Service: Urology;  Laterality: Bilateral;     Current Outpatient Medications  Medication Sig Dispense Refill  . aspirin 81 MG tablet Take 81 mg by mouth every other day.     . clopidogrel (PLAVIX) 75 MG tablet Take 1 tablet by mouth  Daily 90 tablet 3  . Coenzyme Q10 (CO Q-10) 300 MG CAPS Take 300 mg by mouth daily.    . Glucosamine-Chondroit-Vit C-Mn (GLUCOSAMINE CHONDR 1500 COMPLX PO) Take 1 tablet by mouth 2 (two) times daily. 1500-1200    . Multiple Vitamins-Minerals (CENTRUM PO) Take 1 tablet by mouth daily.     . nitroGLYCERIN (NITROSTAT) 0.4 MG SL tablet Place 1 tablet (0.4 mg total) under the tongue every 5 (five) minutes as needed for chest pain. 25 tablet 3  . Omega-3 Fatty Acids (FISH OIL) 1200 MG CAPS Take 1,200 mg by mouth 2 (two) times daily.     Marland Kitchen POTASSIUM BICARB & CHLORIDE PO Take by mouth every other day.    . sildenafil (REVATIO) 20 MG tablet TAKE 3 TO 5 TABLETS BY MOUTH 1 HOUR  PRIOR  TO  SEXUAL  ACTIVITY 30 tablet 0  . simvastatin (ZOCOR) 40 MG tablet Take 1 tablet by mouth at  Bedtime 90 tablet 3   No current facility-administered  medications for this visit.    Allergies:   Meperidine; Pseudoephedrine hcl; Antihistamines, chlorpheniramine-type; and Sudafed [pseudoephedrine hcl]    Social History:  The patient  reports that he has never smoked. He has never used smokeless tobacco. He reports that he does not drink alcohol and does not use drugs.   Family History:  The patient's family history includes Heart Problems in his unknown relative; Heart attack in his brother and father.    ROS:  Please see the history of present illness.   Otherwise, review of systems are positive for erectile dysfunction.   All other systems are reviewed and negative.    PHYSICAL EXAM: VS:  BP 110/70   Pulse (!) 54   Ht 5\' 5"  (1.651 m)   Wt 170 lb 6.4 oz (77.3  kg)   SpO2 96%   BMI 28.36 kg/m  , BMI Body mass index is 28.36 kg/m. GEN: Well nourished, well developed, in no acute distress  HEENT: normal  Neck: no JVD, carotid bruits, or masses Cardiac: sinus brady; no murmurs, rubs, or gallops,no edema  Respiratory:  clear to auscultation bilaterally, normal work of breathing GI: soft, nontender, nondistended, + BS MS: no deformity or atrophy  Skin: warm and dry, no rash Neuro:  Strength and sensation are intact Psych: euthymic mood, full affect   EKG:   The ekg ordered today demonstrates sinus bradycardia, no ST segment changes   Recent Labs: 01/25/2019: Creatinine, Ser 1.09   Lipid Panel    Component Value Date/Time   CHOL 111 08/12/2014 1004   CHOL 179 03/06/2014 0756   TRIG 86.0 08/12/2014 1004   TRIG 159 (H) 03/06/2014 0756   HDL 36.50 (L) 08/12/2014 1004   HDL 52 03/06/2014 0756   CHOLHDL 3 08/12/2014 1004   VLDL 17.2 08/12/2014 1004   LDLCALC 57 08/12/2014 1004   [place] 95 03/06/2014 0756     Other studies Reviewed: Additional studies/ records that were reviewed today with results demonstrating: PMD labs from 5/21 reviewed. Cr 1.02..   ASSESSMENT AND PLAN:  1. CAD/Old MI: No angina. Continue aggressive secondary prevention.  No bleeding issues. He decreased the frequency of aspirin and is taking clopidogrel daily.  This helped with bruising. No internal bleeding. 2. Hyperlipidemia: LDL 91.  COntinue Zocor.  Whole food  Plant based diet.  3. Thoracic aortic aneurysm: 40 mm in 2015 by MRI.  Follow-up study in 2020 showed that it had enlarged to 44 mm.  MRA to follow size of aneurysm. 4. ED: Sildenafil works, but wants to consider Wave Tech therapy.     Current medicines are reviewed at length with the patient today.  The patient concerns regarding his medicines were addressed.  The following changes have been made:  No change  Labs/ tests ordered today include:  No orders of the defined types were placed in this  encounter.   Recommend 150 minutes/week of aerobic exercise Low fat, low carb, high fiber diet recommended  Disposition:   FU in 1 year   Signed, Larae Grooms, MD  11/12/2019 9:53 AM    [facility] [place], [place], [place]  [address] Phone: [phone]; Fax: [phone]

## 2019-11-12 ENCOUNTER — Encounter: Payer: Self-pay | Admitting: Interventional Cardiology

## 2019-11-12 ENCOUNTER — Ambulatory Visit (INDEPENDENT_AMBULATORY_CARE_PROVIDER_SITE_OTHER): Payer: Medicare Other | Admitting: Interventional Cardiology

## 2019-11-12 ENCOUNTER — Other Ambulatory Visit: Payer: Self-pay

## 2019-11-12 VITALS — BP 110/70 | HR 54 | Ht 65.0 in | Wt 170.4 lb

## 2019-11-12 DIAGNOSIS — I252 Old myocardial infarction: Secondary | ICD-10-CM

## 2019-11-12 DIAGNOSIS — I712 Thoracic aortic aneurysm, without rupture, unspecified: Secondary | ICD-10-CM

## 2019-11-12 DIAGNOSIS — N529 Male erectile dysfunction, unspecified: Secondary | ICD-10-CM

## 2019-11-12 DIAGNOSIS — E7800 Pure hypercholesterolemia, unspecified: Secondary | ICD-10-CM

## 2019-11-12 DIAGNOSIS — I25119 Atherosclerotic heart disease of native coronary artery with unspecified angina pectoris: Secondary | ICD-10-CM

## 2019-11-12 NOTE — Addendum Note (Signed)
Addended by: Daleen Bo I on: 11/12/2019 10:28 AM   Modules accepted: Orders

## 2019-11-12 NOTE — Patient Instructions (Addendum)
Medication Instructions:  Your physician recommends that you continue on your current medications as directed. Please refer to the Current Medication list given to you today.  *If you need a refill on your cardiac medications before your next appointment, please call your pharmacy*   Lab Work: None  If you have labs (blood work) drawn today and your tests are completely normal, you will receive your results only by:  MyChart Message (if you have MyChart) OR  A paper copy in the mail If you have any lab test that is abnormal or we need to change your treatment, we will call you to review the results.   Testing/Procedures: Your physician recommends that you have a MRA of the chest to look at your thoracic aorta in September 2021   Follow-Up: At [facility], you and your health needs are our priority.  As part of our continuing mission to provide you with exceptional heart care, we have created designated Provider Care Teams.  These Care Teams include your primary Cardiologist (physician) and Advanced Practice Providers (APPs -  Physician Assistants and Nurse Practitioners) who all work together to provide you with the care you need, when you need it.  We recommend signing up for the patient portal called "MyChart".  Sign up information is provided on this After Visit Summary.  MyChart is used to connect with patients for Virtual Visits (Telemedicine).  Patients are able to view lab/test results, encounter notes, upcoming appointments, etc.  Non-urgent messages can be sent to your provider as well.   To learn more about what you can do with MyChart, go to [email or web address].    Your next appointment:   12 month(s)  The format for your next appointment:   In Person  Provider:   You may see Lance Muss, MD or one of the following Advanced Practice Providers on your designated Care Team:    Ronie Spies, PA-C  Jacolyn Reedy, PA-C   Other Instructions None

## 2020-01-07 ENCOUNTER — Other Ambulatory Visit: Payer: Self-pay | Admitting: Interventional Cardiology

## 2020-01-07 NOTE — Telephone Encounter (Signed)
Pt's pharmacy is requesting a refill on sildenafil. Would Dr. Varanasi like to refill this medication? Please address 

## 2020-01-18 ENCOUNTER — Other Ambulatory Visit: Payer: Self-pay

## 2020-01-18 ENCOUNTER — Other Ambulatory Visit: Payer: Medicare Other | Admitting: *Deleted

## 2020-01-18 DIAGNOSIS — I712 Thoracic aortic aneurysm, without rupture, unspecified: Secondary | ICD-10-CM

## 2020-01-18 DIAGNOSIS — I252 Old myocardial infarction: Secondary | ICD-10-CM

## 2020-01-18 DIAGNOSIS — E7800 Pure hypercholesterolemia, unspecified: Secondary | ICD-10-CM

## 2020-01-18 DIAGNOSIS — N529 Male erectile dysfunction, unspecified: Secondary | ICD-10-CM

## 2020-01-18 DIAGNOSIS — I25119 Atherosclerotic heart disease of native coronary artery with unspecified angina pectoris: Secondary | ICD-10-CM

## 2020-01-18 LAB — BASIC METABOLIC PANEL
BUN/Creatinine Ratio: 16 (ref 10–24)
BUN: 16 mg/dL (ref 8–27)
CO2: 24 mmol/L (ref 20–29)
Calcium: 9.3 mg/dL (ref 8.6–10.2)
Chloride: 102 mmol/L (ref 96–106)
Creatinine, Ser: 1.02 mg/dL (ref 0.76–1.27)
GFR calc Af Amer: 85 mL/min/{1.73_m2} (ref >59)
GFR calc non Af Amer: 74 mL/min/{1.73_m2} (ref >59)
Glucose: 89 mg/dL (ref 65–99)
Potassium: 4.2 mmol/L (ref 3.5–5.2)
Sodium: 139 mmol/L (ref 134–144)

## 2020-01-25 ENCOUNTER — Other Ambulatory Visit: Payer: Self-pay

## 2020-01-25 ENCOUNTER — Ambulatory Visit (HOSPITAL_COMMUNITY)
Admission: RE | Admit: 2020-01-25 | Discharge: 2020-01-25 | Disposition: A | Discharge status: Home / Self Care | Payer: Medicare Other | Source: Ambulatory Visit | Attending: Interventional Cardiology | Admitting: Interventional Cardiology

## 2020-01-25 DIAGNOSIS — I712 Thoracic aortic aneurysm, without rupture, unspecified: Secondary | ICD-10-CM

## 2020-01-25 MED ORDER — GADOBUTROL 1 MMOL/ML IV SOLN
7.5000 mL | Freq: Once | INTRAVENOUS | Status: AC | PRN
  Administered 2020-01-25: 7.5 mL via INTRAVENOUS

## 2020-03-10 ENCOUNTER — Other Ambulatory Visit: Payer: Self-pay | Admitting: Interventional Cardiology

## 2020-03-11 NOTE — Telephone Encounter (Signed)
Pt's pharmacy is requesting a refill on sildenafil. Would Dr. Varanasi like to refill this medication? Please address 

## 2020-03-12 NOTE — Telephone Encounter (Signed)
Refill sent in

## 2020-06-24 DIAGNOSIS — R059 Cough, unspecified: Secondary | ICD-10-CM | POA: Diagnosis not present

## 2020-06-24 DIAGNOSIS — U071 COVID-19: Secondary | ICD-10-CM | POA: Diagnosis not present

## 2020-06-24 DIAGNOSIS — R509 Fever, unspecified: Secondary | ICD-10-CM | POA: Diagnosis not present

## 2020-06-24 DIAGNOSIS — R0981 Nasal congestion: Secondary | ICD-10-CM | POA: Diagnosis not present

## 2020-10-10 ENCOUNTER — Other Ambulatory Visit: Payer: Self-pay | Admitting: Interventional Cardiology

## 2020-11-24 ENCOUNTER — Other Ambulatory Visit: Payer: Self-pay | Admitting: Interventional Cardiology

## 2020-11-25 NOTE — Telephone Encounter (Signed)
Pt's pharmacy is requesting a refill on sildenafil. Would Dr. Varanasi like to refill this medication? Please address 

## 2020-11-27 ENCOUNTER — Other Ambulatory Visit: Payer: Self-pay

## 2020-12-18 ENCOUNTER — Ambulatory Visit: Payer: Medicare Other | Admitting: Interventional Cardiology

## 2020-12-30 ENCOUNTER — Other Ambulatory Visit: Payer: Self-pay | Admitting: Interventional Cardiology

## 2021-02-11 ENCOUNTER — Other Ambulatory Visit: Payer: Self-pay

## 2021-02-11 ENCOUNTER — Other Ambulatory Visit: Payer: Self-pay | Admitting: Interventional Cardiology

## 2021-02-11 MED ORDER — SILDENAFIL CITRATE 20 MG PO TABS: ORAL_TABLET | ORAL | 0 refills | Status: DC

## 2021-02-11 NOTE — Telephone Encounter (Signed)
Refill sent to pharmacy.   

## 2021-03-25 ENCOUNTER — Other Ambulatory Visit: Payer: Self-pay | Admitting: Interventional Cardiology

## 2021-04-06 ENCOUNTER — Other Ambulatory Visit: Payer: Self-pay | Admitting: Interventional Cardiology

## 2021-04-06 NOTE — Progress Notes (Signed)
Cardiology Office Note   Date:  04/07/2021   ID:  Zyiere, Rosemond 07-Dec-1950, MRN [id number]  PCP:  Juluis Rainier, MD    No chief complaint on file.  CAD  Wt Readings from Last 3 Encounters:  04/07/21 178 lb (80.7 kg)  11/12/19 170 lb 6.4 oz (77.3 kg)  11/03/18 175 lb (79.4 kg)       History of Present Illness: Chad Savage is a 70 y.o. male   with multivessel stents, LAD and RCA in 2008. He has some joint pains and lightheadedness when he is tired. No syncope. He had a hydrocele operation in 2014 and tolerated this well.   He has continued the same activities. He still walks and does pushups for exercise. Joint pain limits him somewhat but better now.  He does exercises for his knees to help reduce discomfort.     He stopped some meds in 2019 because he was not having angina.    He had MRA of his thoracic aortic aneurysm in August 2020, and it measured 4.4 cm.    Took supplements (potassium bicarbonate OTC- he believes it improves his pH).  Since the last visit, he remains.  Walking 30 minutes a day, 4-5 times a week.  In winter 2021, he had both the flu and COVID.   Denies : Chest pain. Dizziness. Leg edema. Nitroglycerin use. Orthopnea. Palpitations. Paroxysmal nocturnal dyspnea. Shortness of breath. Syncope.     Past Medical History:  Diagnosis Date   Coronary artery disease 2008   Hypercholesteremia    Myocardial infarction [facility])    Thoracic aneurysm March 2013   4 cm without complicating features    Past Surgical History:  Procedure Laterality Date   CARDIAC CATHETERIZATION  2008   CORONARY ANGIOPLASTY  2008-August   second stent placed   HYDROCELE EXCISION  06/20/2012   Procedure: HYDROCELECTOMY ADULT;  Surgeon: Anner Crete, MD;  Location: [facility];  Service: Urology;  Laterality: Bilateral;     Current Outpatient Medications  Medication Sig Dispense Refill   aspirin 81 MG tablet Take 81 mg by mouth every other day.       clopidogrel (PLAVIX) 75 MG tablet Take one tablet my mouth ( 75 mg ) daily. .  Patient cannot get 1 year supply till patient keep appointment in November. 90 tablet 0   Coenzyme Q10 (CO Q-10) 300 MG CAPS Take 300 mg by mouth daily.     Collagen-Vitamin C (COLLAGEN PLUS VITAMIN C PO)      Glucosamine-Chondroit-Vit C-Mn (GLUCOSAMINE CHONDR 1500 COMPLX PO) Take 1 tablet by mouth 2 (two) times daily. 1500-1200     Multiple Vitamins-Minerals (CENTRUM PO) Take 1 tablet by mouth daily.      nitroGLYCERIN (NITROSTAT) 0.4 MG SL tablet Place 1 tablet (0.4 mg total) under the tongue every 5 (five) minutes as needed for chest pain. 25 tablet 3   Omega-3 Fatty Acids (FISH OIL) 1200 MG CAPS Take 1,200 mg by mouth 2 (two) times daily.      POTASSIUM BICARB & CHLORIDE PO Take by mouth every other day.     sildenafil (REVATIO) 20 MG tablet TAKE 3 TO 5 TABLETS BY MOUTH ONE  HOUR  PRIOR  TO  SEXUAL  ACTIVITY 30 tablet 4   simvastatin (ZOCOR) 40 MG tablet Take one tablet by mouth ( 40 mg) daily.  Patient cannot get 1 year supply till patient keep appointment in November. 90 tablet 0  No current facility-administered medications for this visit.    Allergies:   Meperidine; Pseudoephedrine hcl; Antihistamines, chlorpheniramine-type; and Sudafed [pseudoephedrine hcl]    Social History:  The patient  reports that he has never smoked. He has never used smokeless tobacco. He reports that he does not drink alcohol and does not use drugs.   Family History:  The patient's family history includes Heart Problems in his unknown relative; Heart attack in his brother and father.    ROS:  Please see the history of present illness.   Otherwise, review of systems are positive for mild joint pain.   All other systems are reviewed and negative.    PHYSICAL EXAM: VS:  BP 128/72   Pulse (!) 57   Ht 5\' 5"  (1.651 m)   Wt 178 lb (80.7 kg)   SpO2 96%   BMI 29.62 kg/m  , BMI Body mass index is 29.62 kg/m. GEN: Well nourished,  well developed, in no acute distress HEENT: normal Neck: no JVD, carotid bruits, or masses Cardiac: RRR; no murmurs, rubs, or gallops,no edema  Respiratory:  clear to auscultation bilaterally, normal work of breathing GI: soft, nontender, nondistended, + BS MS: no deformity or atrophy Skin: warm and dry, no rash Neuro:  Strength and sensation are intact Psych: euthymic mood, full affect   EKG:   The ekg ordered today demonstrates NSR, no ST changes   Recent Labs: No results found for requested labs within last 8760 hours.   Lipid Panel    Component Value Date/Time   CHOL 111 08/12/2014 1004   CHOL 179 03/06/2014 0756   TRIG 86.0 08/12/2014 1004   TRIG 159 (H) 03/06/2014 0756   HDL 36.50 (L) 08/12/2014 1004   HDL 52 03/06/2014 0756   CHOLHDL 3 08/12/2014 1004   VLDL 17.2 08/12/2014 1004   LDLCALC 57 08/12/2014 1004   LDLCALC 95 03/06/2014 0756     Other studies Reviewed: Additional studies/ records that were reviewed today with results demonstrating: labs reviewed.   ASSESSMENT AND PLAN:  CAD/Old MI: Continue aggressive secondary prevention. Continue DAPT. No bleeding problems.  Hyperlipidemia: Whole food, plant based diet. LDL 82.  Continue current lipid-lowering therapy.  Consider changing to high potency statin such as atorvastatin or rosuvastatin.  Since he is feeling well at this time, he is generally hesitant to make any changes. Thoracic aortic aneurysm: 4.4 cm at check in 2020, 4.0 cm in 2021.  No sx. no beta-blocker due to bradycardia. Erectile dysfunction: Sildenafil has worked in the past.  He wanted to consider wave tech therapy. PreDM: A1C 6.0.  High-fiber, plant-based diet.  Eats oatmeal frequently, nut, blueberries.  Tries to stick to lean meat.   Current medicines are reviewed at length with the patient today.  The patient concerns regarding his medicines were addressed.  The following changes have been made:  No change  Labs/ tests ordered today  include:  No orders of the defined types were placed in this encounter.   Recommend 150 minutes/week of aerobic exercise Low fat, low carb, high fiber diet recommended  Disposition:   FU in 1 year   Signed, 2022, MD  04/07/2021 8:59 AM    [facility] [address] [place], [place], [place]  [place] Phone: [phone]; Fax: [phone]

## 2021-04-07 ENCOUNTER — Encounter: Payer: Self-pay | Admitting: Interventional Cardiology

## 2021-04-07 ENCOUNTER — Other Ambulatory Visit: Payer: Self-pay

## 2021-04-07 ENCOUNTER — Ambulatory Visit: Payer: Medicare Other | Admitting: Interventional Cardiology

## 2021-04-07 VITALS — BP 128/72 | HR 57 | Ht 65.0 in | Wt 178.0 lb

## 2021-04-07 DIAGNOSIS — I25119 Atherosclerotic heart disease of native coronary artery with unspecified angina pectoris: Secondary | ICD-10-CM | POA: Diagnosis not present

## 2021-04-07 DIAGNOSIS — I252 Old myocardial infarction: Secondary | ICD-10-CM

## 2021-04-07 DIAGNOSIS — E7800 Pure hypercholesterolemia, unspecified: Secondary | ICD-10-CM | POA: Diagnosis not present

## 2021-04-07 DIAGNOSIS — I7121 Aneurysm of the ascending aorta, without rupture: Secondary | ICD-10-CM | POA: Diagnosis not present

## 2021-04-07 MED ORDER — SIMVASTATIN 40 MG PO TABS: ORAL_TABLET | ORAL | 3 refills | Status: DC

## 2021-04-07 MED ORDER — CLOPIDOGREL BISULFATE 75 MG PO TABS: ORAL_TABLET | ORAL | 3 refills | Status: DC

## 2021-04-07 NOTE — Telephone Encounter (Signed)
OK to refill

## 2021-04-07 NOTE — Patient Instructions (Signed)

## 2021-10-07 DIAGNOSIS — L089 Local infection of the skin and subcutaneous tissue, unspecified: Secondary | ICD-10-CM | POA: Diagnosis not present

## 2021-11-09 DIAGNOSIS — M545 Low back pain, unspecified: Secondary | ICD-10-CM | POA: Diagnosis not present

## 2021-11-27 ENCOUNTER — Other Ambulatory Visit: Payer: Self-pay | Admitting: Interventional Cardiology

## 2022-02-12 ENCOUNTER — Other Ambulatory Visit: Payer: Self-pay | Admitting: Interventional Cardiology

## 2022-03-10 ENCOUNTER — Other Ambulatory Visit: Payer: Self-pay | Admitting: Interventional Cardiology

## 2022-03-10 NOTE — Telephone Encounter (Signed)
Pt's pharmacy is requesting a refill on sildenafil. Would Dr. Varanasi like to refill this medication? Please address 

## 2022-03-12 ENCOUNTER — Other Ambulatory Visit: Payer: Self-pay | Admitting: Interventional Cardiology

## 2022-04-16 DIAGNOSIS — R197 Diarrhea, unspecified: Secondary | ICD-10-CM | POA: Diagnosis not present

## 2022-04-18 NOTE — Progress Notes (Unsigned)
Cardiology Office Note   Date:  04/19/2022   ID:  [name], [name] 26-Nov-1950, MRN [id number]  PCP:  Chad Ruff, MD (Inactive)    No chief complaint on file.  CAD  Wt Readings from Last 3 Encounters:  04/19/22 169 lb 3.2 oz (76.7 kg)  04/07/21 178 lb (80.7 kg)  11/12/19 170 lb 6.4 oz (77.3 kg)       History of Present Illness: Chad Savage is a 71 y.o. male   with multivessel stents, LAD and RCA in 2008. He has some joint pains and lightheadedness when he is tired. No syncope. He had a hydrocele operation in 2014 and tolerated this well.   He has continued the same activities. He still walks and does pushups for exercise. Joint pain limits him somewhat but better now.  He does exercises for his knees to help reduce discomfort.     He stopped some meds in 2019 because he was not having angina.    He had MRA of his thoracic aortic aneurysm in August 2020, and it measured 4.4 cm.     Took supplements (potassium bicarbonate OTC- he believes it improves his pH).  In winter 2021, he had both the flu and COVID.    Denies : Chest pain. Dizziness. Leg edema. Nitroglycerin use. Orthopnea. Palpitations. Paroxysmal nocturnal dyspnea. Shortness of breath. Syncope.    Had a cold and some diarrhea in the past few weeks.  30 minutes of walking 5-6 times a week.  No symptoms with any of that activity.  That includes walking up hills.  Continuing the floor exercises, PT exercises, push-ups.   Past Medical History:  Diagnosis Date   Coronary artery disease 2008   Hypercholesteremia    Myocardial infarction [facility])    Thoracic aneurysm March 2013   4 cm without complicating features    Past Surgical History:  Procedure Laterality Date   CARDIAC CATHETERIZATION  2008   CORONARY ANGIOPLASTY  2008-August   second stent placed   HYDROCELE EXCISION  06/20/2012   Procedure: HYDROCELECTOMY ADULT;  Surgeon: Malka So, MD;  Location: [facility];  Service:  Urology;  Laterality: Bilateral;     Current Outpatient Medications  Medication Sig Dispense Refill   Ascorbic Acid (VITAMIN C) 500 MG CHEW      aspirin 81 MG tablet Take 81 mg by mouth every other day.      clopidogrel (PLAVIX) 75 MG tablet TAKE 1 TABLET BY MOUTH MY MOUTH  DAILY 90 tablet 0   Coenzyme Q10 (CO Q-10) 300 MG CAPS Take 300 mg by mouth daily.     Collagen-Vitamin C (COLLAGEN PLUS VITAMIN C PO)      cyclobenzaprine (FLEXERIL) 10 MG tablet SMARTSIG:1 Tablet(s) By Mouth 1-3 Times Daily PRN     Glucosamine-Chondroit-Vit C-Mn (GLUCOSAMINE CHONDR 1500 COMPLX PO) Take 1 tablet by mouth 2 (two) times daily. 1500-1200     Multiple Vitamins-Minerals (CENTRUM PO) Take 1 tablet by mouth daily.      Omega-3 Fatty Acids (FISH OIL) 1200 MG CAPS Take 1,200 mg by mouth 2 (two) times daily.      POTASSIUM BICARB & CHLORIDE PO Take by mouth every other day.     sildenafil (REVATIO) 20 MG tablet TAKE 3 TO 5 TABLETS BY MOUTH ONE  HOUR  PRIOR  TO  SEXUAL  ACTIVITY--please call office to schedule one year follow up appointment 30 tablet 0   simvastatin (ZOCOR) 40 MG tablet TAKE 1  TABLET BY MOUTH DAILY 90 tablet 0   nitroGLYCERIN (NITROSTAT) 0.4 MG SL tablet Place 1 tablet (0.4 mg total) under the tongue every 5 (five) minutes as needed for chest pain. 25 tablet 3   No current facility-administered medications for this visit.    Allergies:   Meperidine; Pseudoephedrine hcl; Antihistamines, chlorpheniramine-type; and Sudafed [pseudoephedrine hcl]    Social History:  The patient  reports that he has never smoked. He has never used smokeless tobacco. He reports that he does not drink alcohol and does not use drugs.   Family History:  The patient's family history includes Heart Problems in his unknown relative; Heart attack in his brother and father.    ROS:  Please see the history of present illness.   Otherwise, review of systems are positive for occasional back pain.   All other systems are  reviewed and negative.    PHYSICAL EXAM: VS:  BP 116/72   Pulse (!) 59   Ht 5\' 4"  (1.626 m)   Wt 169 lb 3.2 oz (76.7 kg)   SpO2 96%   BMI 29.04 kg/m  , BMI Body mass index is 29.04 kg/m. GEN: Well nourished, well developed, in no acute distress HEENT: normal Neck: no JVD, carotid bruits, or masses Cardiac: RRR; no murmurs, rubs, or gallops,no edema  Respiratory:  clear to auscultation bilaterally, normal work of breathing GI: soft, nontender, nondistended, + BS MS: no deformity or atrophy Skin: warm and dry, no rash Neuro:  Strength and sensation are intact Psych: euthymic mood, full affect   EKG:   The ekg ordered today demonstrates sinus bradycardia, no significant ST-T wave changes   Recent Labs: No results found for requested labs within last 365 days.   Lipid Panel    Component Value Date/Time   CHOL 111 08/12/2014 1004   CHOL 179 03/06/2014 0756   TRIG 86.0 08/12/2014 1004   TRIG 159 (H) 03/06/2014 0756   HDL 36.50 (L) 08/12/2014 1004   HDL 52 03/06/2014 0756   CHOLHDL 3 08/12/2014 1004   VLDL 17.2 08/12/2014 1004   LDLCALC 57 08/12/2014 1004   [place] 95 03/06/2014 0756     Other studies Reviewed: Additional studies/ records that were reviewed today with results demonstrating: Cholesterol 142, HDL 36, LDL 82, triglycerides 133, A1c 6.0 all in May 2022.  In November 2023 creatinine 0.9, glucose 76, potassium 4.7, AST 21, ALT 8, alkaline phosphatase 63, hemoglobin 15.1 hematocrit 46.2 white blood cell count 6.7, platelet count 220   ASSESSMENT AND PLAN:  CAD/Old MI: No angina. Continue aggressive secondary prevention.  No bleeding problems on aspirin and Plavix.  Hemoglobin stable at recent check with primary care doctor. Hyperlipidemia: Lipid  test today.  Normal LFTs checked at his primary care doctor's office a few days ago. THoracic aortic aneurysm: in 2021: Stable, small thoracic aortic aneurysm, measuring 40 mm.  Plan for repeat MRA to assess size.    Erectile dysfunction: Was considering wave tech therapy.  He did this and had some improvement.  PreDM: Healthy lifestyle.  Continue regular exercise and healthy diet.   Current medicines are reviewed at length with the patient today.  The patient concerns regarding his medicines were addressed.  The following changes have been made:  No change  Labs/ tests ordered today include:  No orders of the defined types were placed in this encounter.   Recommend 150 minutes/week of aerobic exercise Low fat, low carb, high fiber diet recommended  Disposition:   FU in  1 year   Signed, Lance Muss, MD  04/19/2022 8:31 AM    [facility] [address] [place], [place], [place]  [address] Phone: [phone]; Fax: [phone]

## 2022-04-19 ENCOUNTER — Ambulatory Visit: Payer: Medicare Other | Attending: Interventional Cardiology | Admitting: Interventional Cardiology

## 2022-04-19 ENCOUNTER — Encounter: Payer: Self-pay | Admitting: Interventional Cardiology

## 2022-04-19 VITALS — BP 116/72 | HR 59 | Ht 64.0 in | Wt 169.2 lb

## 2022-04-19 DIAGNOSIS — E7800 Pure hypercholesterolemia, unspecified: Secondary | ICD-10-CM | POA: Diagnosis not present

## 2022-04-19 DIAGNOSIS — I25119 Atherosclerotic heart disease of native coronary artery with unspecified angina pectoris: Secondary | ICD-10-CM | POA: Diagnosis not present

## 2022-04-19 DIAGNOSIS — I252 Old myocardial infarction: Secondary | ICD-10-CM | POA: Diagnosis not present

## 2022-04-19 DIAGNOSIS — R7303 Prediabetes: Secondary | ICD-10-CM

## 2022-04-19 DIAGNOSIS — I7121 Aneurysm of the ascending aorta, without rupture: Secondary | ICD-10-CM | POA: Diagnosis not present

## 2022-04-19 NOTE — Patient Instructions (Signed)
Medication Instructions:  Your physician recommends that you continue on your current medications as directed. Please refer to the Current Medication list given to you today.  *If you need a refill on your cardiac medications before your next appointment, please call your pharmacy*   Lab Work: Lab work to be done today--Lipids and A1C If you have labs (blood work) drawn today and your tests are completely normal, you will receive your results only by: MyChart Message (if you have MyChart) OR A paper copy in the mail If you have any lab test that is abnormal or we need to change your treatment, we will call you to review the results.   Testing/Procedures: Dr Eldridge Dace recommends you have a chest MRA   Follow-Up: At [facility], you and your health needs are our priority.  As part of our continuing mission to provide you with exceptional heart care, we have created designated Provider Care Teams.  These Care Teams include your primary Cardiologist (physician) and Advanced Practice Providers (APPs -  Physician Assistants and Nurse Practitioners) who all work together to provide you with the care you need, when you need it.  We recommend signing up for the patient portal called "MyChart".  Sign up information is provided on this After Visit Summary.  MyChart is used to connect with patients for Virtual Visits (Telemedicine).  Patients are able to view lab/test results, encounter notes, upcoming appointments, etc.  Non-urgent messages can be sent to your provider as well.   To learn more about what you can do with MyChart, go to [email or web address].    Your next appointment:   12 month(s)  The format for your next appointment:   In Person  Provider:   Lance Muss, MD     Other Instructions    Important Information About Sugar

## 2022-04-20 LAB — HEMOGLOBIN A1C
Est. average glucose Bld gHb Est-mCnc: 117 mg/dL
Hgb A1c MFr Bld: 5.7 % — ABNORMAL HIGH (ref 4.8–5.6)

## 2022-04-20 LAB — LIPID PANEL
Chol/HDL Ratio: 4.8 ratio (ref 0.0–5.0)
Cholesterol, Total: 155 mg/dL (ref 100–199)
HDL: 32 mg/dL — ABNORMAL LOW (ref >39)
LDL Chol Calc (NIH): 100 mg/dL — ABNORMAL HIGH (ref 0–99)
Triglycerides: 124 mg/dL (ref 0–149)
VLDL Cholesterol Cal: 23 mg/dL (ref 5–40)

## 2022-04-21 ENCOUNTER — Telehealth: Payer: Self-pay | Admitting: *Deleted

## 2022-04-21 DIAGNOSIS — E7800 Pure hypercholesterolemia, unspecified: Secondary | ICD-10-CM

## 2022-04-21 NOTE — Telephone Encounter (Signed)
-----   Message from Corky Crafts, MD sent at 04/20/2022  3:24 PM EST ----- A1C is stable. LDL at 100.  Would like to see it at 70. WOuld switch simvastatin to rosuvastatin 20 mg daily.  Recheck lipids and liver in 3 months.

## 2022-04-21 NOTE — Telephone Encounter (Signed)
I spoke with patient and reviewed results/recommendations with him.  He reports he has been having diarrhea for awhile and eating a lot of eggs as it was one of the few things he was able to tolerate.  He thinks numbers will improve when he gets back to regular diet and does not want to make medication changes at this time.  He will make dietary changes and continue simvastatin.  He will come to office for fasting lab work on 07/21/22

## 2022-05-03 ENCOUNTER — Ambulatory Visit (HOSPITAL_COMMUNITY)
Admission: RE | Admit: 2022-05-03 | Discharge: 2022-05-03 | Disposition: A | Discharge status: Home / Self Care | Payer: Medicare Other | Source: Ambulatory Visit | Attending: Interventional Cardiology | Admitting: Interventional Cardiology

## 2022-05-03 DIAGNOSIS — I7121 Aneurysm of the ascending aorta, without rupture: Secondary | ICD-10-CM

## 2022-05-03 DIAGNOSIS — I712 Thoracic aortic aneurysm, without rupture, unspecified: Secondary | ICD-10-CM | POA: Diagnosis not present

## 2022-05-03 DIAGNOSIS — L723 Sebaceous cyst: Secondary | ICD-10-CM | POA: Diagnosis not present

## 2022-05-03 DIAGNOSIS — I771 Stricture of artery: Secondary | ICD-10-CM | POA: Diagnosis not present

## 2022-05-03 MED ORDER — GADOBUTROL 1 MMOL/ML IV SOLN
10.0000 mL | Freq: Once | INTRAVENOUS | Status: AC | PRN
  Administered 2022-05-03: 7.5 mL via INTRAVENOUS

## 2022-05-05 ENCOUNTER — Other Ambulatory Visit: Payer: Self-pay | Admitting: Interventional Cardiology

## 2022-05-05 DIAGNOSIS — Z0000 Encounter for general adult medical examination without abnormal findings: Secondary | ICD-10-CM | POA: Diagnosis not present

## 2022-05-12 DIAGNOSIS — R7303 Prediabetes: Secondary | ICD-10-CM | POA: Diagnosis not present

## 2022-05-12 DIAGNOSIS — L989 Disorder of the skin and subcutaneous tissue, unspecified: Secondary | ICD-10-CM | POA: Diagnosis not present

## 2022-05-12 DIAGNOSIS — E7800 Pure hypercholesterolemia, unspecified: Secondary | ICD-10-CM | POA: Diagnosis not present

## 2022-05-12 DIAGNOSIS — M8588 Other specified disorders of bone density and structure, other site: Secondary | ICD-10-CM | POA: Diagnosis not present

## 2022-05-13 ENCOUNTER — Other Ambulatory Visit: Payer: Self-pay | Admitting: Interventional Cardiology

## 2022-05-13 NOTE — Telephone Encounter (Signed)
Pt's pharmacy is requesting a refill on sildenafil. Would Dr. Varanasi like to refill this medication? Please address 

## 2022-05-18 NOTE — Telephone Encounter (Signed)
Refill sent to pharmacy.  Patient reports he is not using NTG.  He is aware not to use NTG and Sildenafil in the same 48 hour period

## 2022-06-25 IMAGING — MR MR MRA CHEST W/ OR W/O CM
12 series · 16 of 16 positions shown · IV contrast (gadavist)
Comparison: 01/25/2019; 08/23/2013; 08/23/2011; chest CT-09/08/2006

CLINICAL DATA: Follow-up thoracic aortic aneurysm

EXAM:
MRA CHEST WITH CONTRAST
TECHNIQUE: Angiographic images of the chest were obtained using MRA technique
with intravenous contrast.
CONTRAST:  7.5mL GADAVIST GADOBUTROL 1 MMOL/ML IV SOLN

[Series 3: bSSFP · axial · 8.0mm · 0.86mm/px · 1 of 29 slices shown (1 of 2)]
[im 1/29]
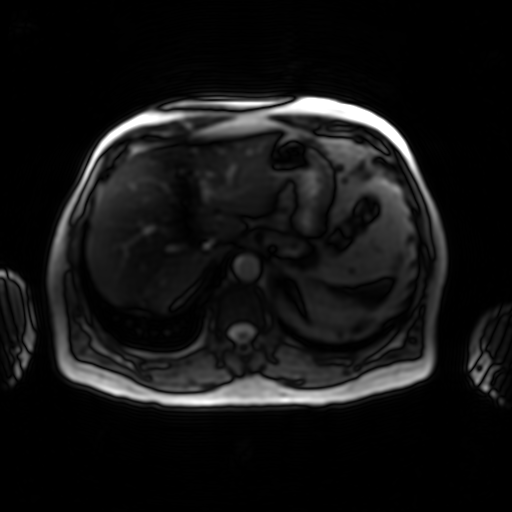

[Series 4: T1 · axial · 8.0mm · 0.82mm/px · 1 of 28 slices shown]
[im 1/28]
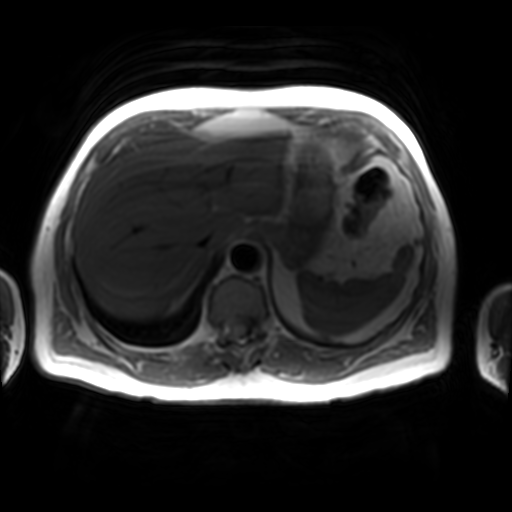

[Series 5: bSSFP · sagittal · 8.0mm · 0.82mm/px · 5 of 220 slices shown (2 of 2)]
[im 1/220]
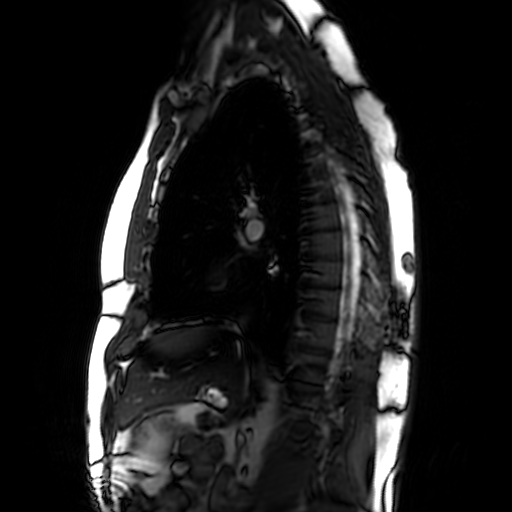
[im 55/220]
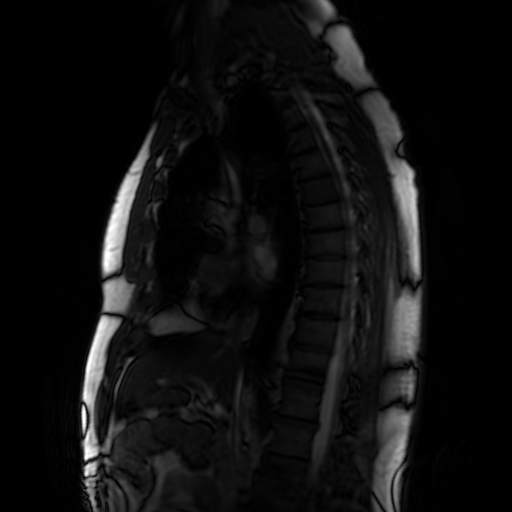
[im 110/220]
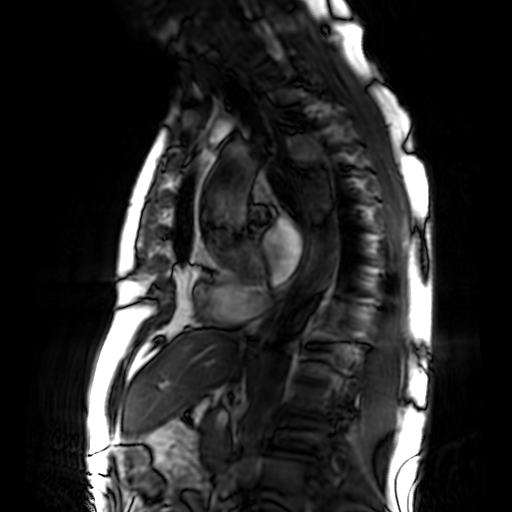
[im 165/220]
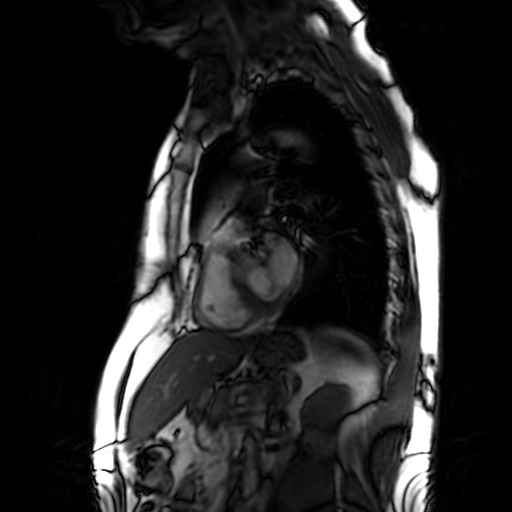
[im 220/220]
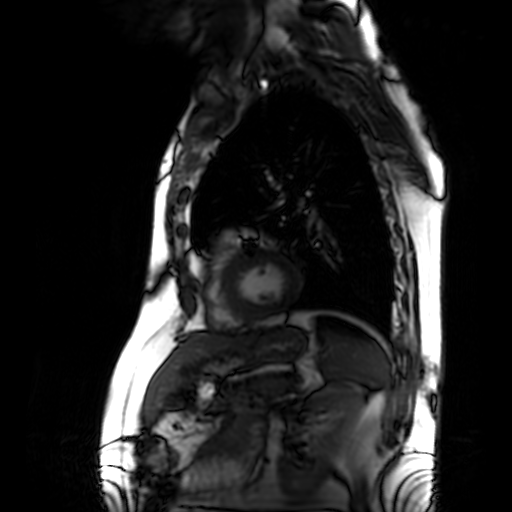

[Series 6: T1 dynamic · axial · 5.0mm · 0.86mm/px · 1 of 52 slices shown (1 of 2)]
[im 1/52]
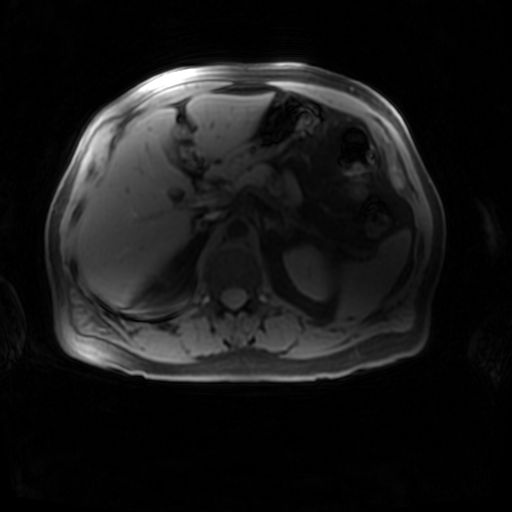

[Series 7: T1 dynamic · coronal · 4.0mm · 0.78mm/px · 1 of 44 slices shown (2 of 2)]
[im 1/44]
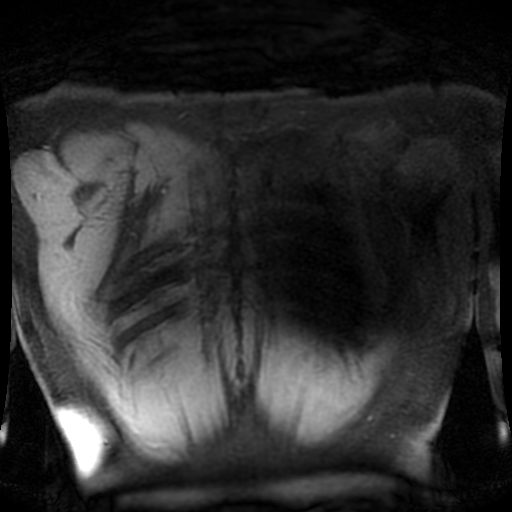

[Series 9: T1 dynamic post-contrast · axial · 5.0mm · 0.86mm/px · 1 of 52 slices shown (1 of 2)]
[im 1/52]
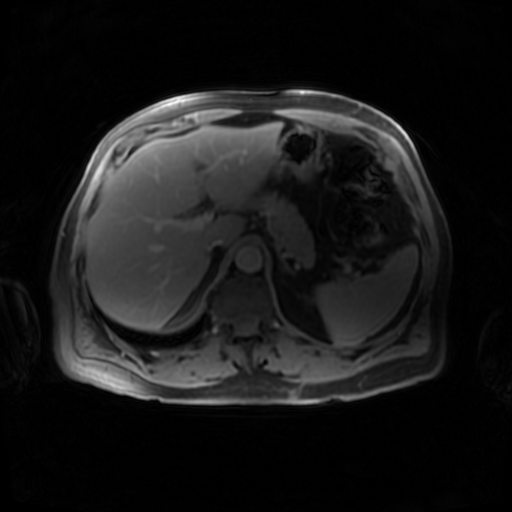

[Series 10: T1 dynamic post-contrast · coronal · 4.0mm · 0.78mm/px · 1 of 44 slices shown (2 of 2)]
[im 1/44]
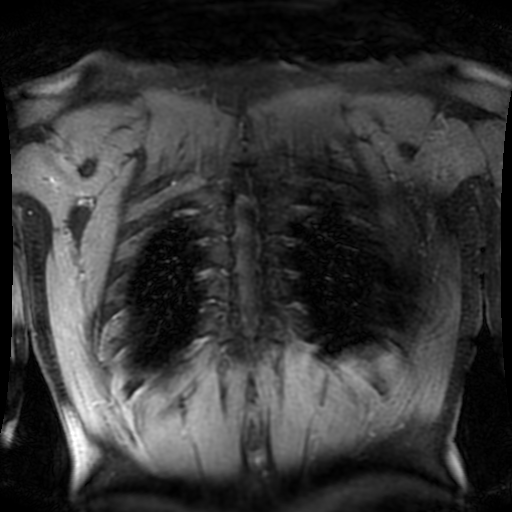

[Series 800: cemra ft mph · sagittal · 3.0mm · 0.78mm/px · 1 of 60 slices shown]
[im 1/60]
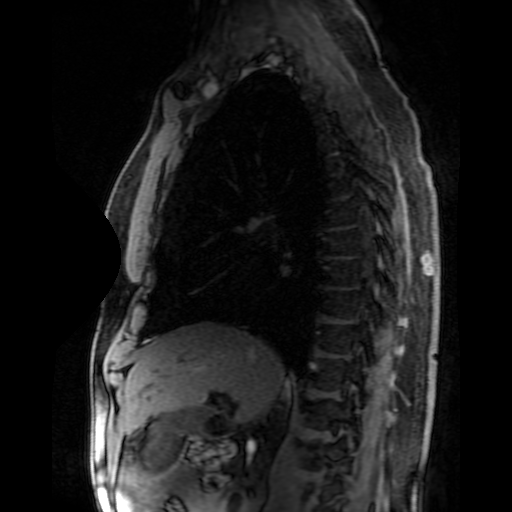

[Series 801: ph1/cemra ft mph · sagittal · 3.0mm · 0.78mm/px · 1 of 60 slices shown]
[im 1/60]
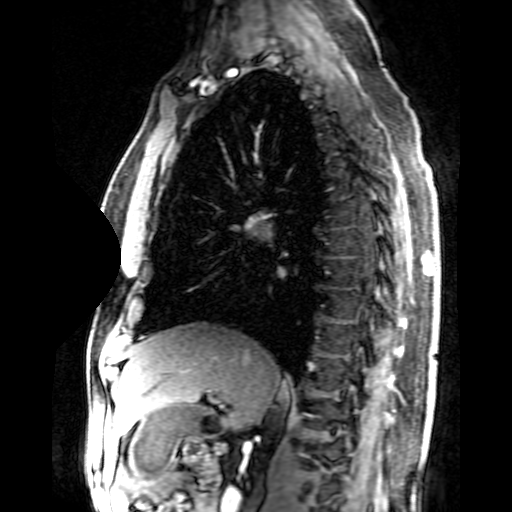

[Series 802: ph2/cemra ft mph · sagittal · 3.0mm · 0.78mm/px · 1 of 60 slices shown]
[im 1/60]
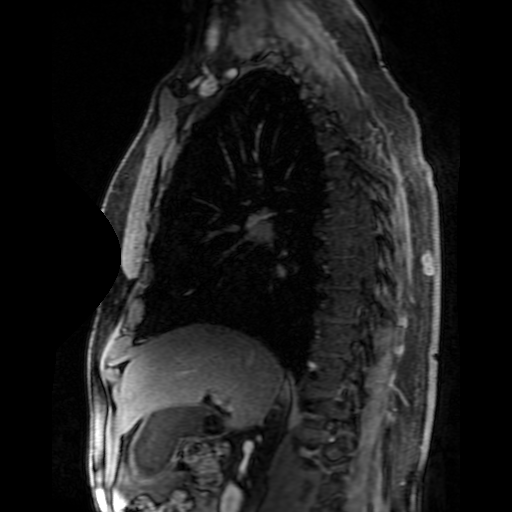

[((date))-((date)) · sagittal · 3.0mm · 0.78mm/px · 1 of 60 slices shown (1 of 2)]
[im 1/60]
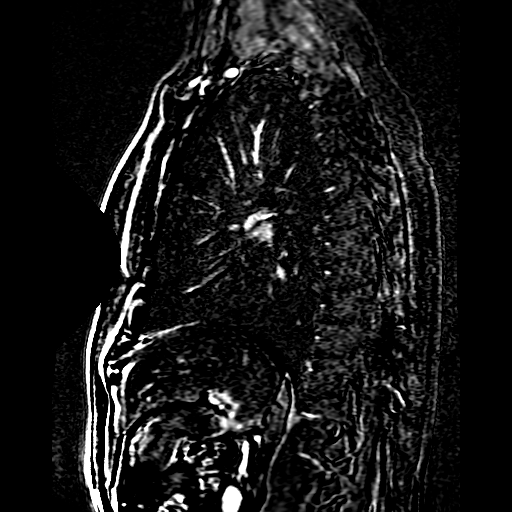

[((date))-((date)) · sagittal · 3.0mm · 0.78mm/px · 1 of 60 slices shown (2 of 2)]
[im 1/60]
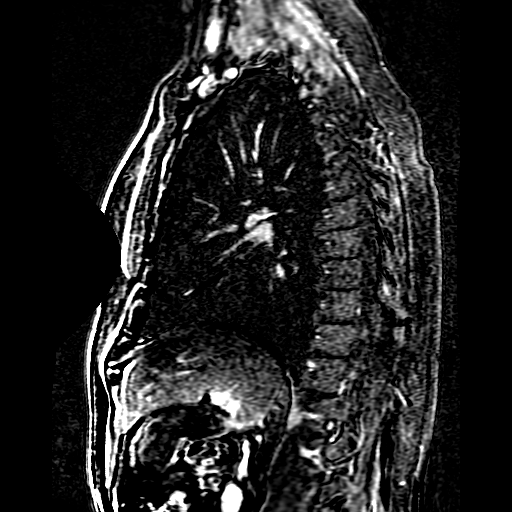

[16 of 16 positions shown; findings below may reference images not displayed]

FINDINGS: Vascular Findings:

Stable mild fusiform aneurysmal dilatation of the ascending thoracic
aorta was measurements as follows. Note is made of a small ductus
diverticulum. The thoracic aorta tapers to a normal caliber at the
level of the aortic arch. No evidence of thoracic aortic dissection
or periaortic stranding on this nongated examination.

Conventional configuration of the aortic arch. The branch vessels of
the aortic arch appear patent throughout their imaged courses

Normal heart size.  No pericardial effusion.

Although this examination was not tailored for the evaluation the
pulmonary arteries, there are no discrete filling defects within the
central pulmonary arterial tree to suggest central pulmonary
embolism. Normal caliber of the main pulmonary artery.

-------------------------------------------------------------

Thoracic aortic measurements:

Sinotubular junction

32 mm as measured in greatest oblique short axis sagittal dimension.

Proximal ascending aorta

40 mm as measured in greatest oblique short axis axial dimension at
the level of the main pulmonary artery (image 25, series 9), grossly
unchanged compared to the 5454 examination.

Aortic arch aorta

24 mm as measured in greatest oblique short axis sagittal dimension.

Proximal descending thoracic aorta

25 mm as measured in greatest oblique short axis axial dimension at
the level of the main pulmonary artery.

Distal descending thoracic aorta

24 mm as measured in greatest oblique short axis axial dimension at
the level of the diaphragmatic hiatus.

Review of the MIP images confirms the above findings.

-------------------------------------------------------------

Non-Vascular Findings:

Mediastinum/Lymph Nodes: No bulky mediastinal, hilar or axillary
lymphadenopathy.

Lungs/Pleura: No focal airspace opacities.  No pleural effusion.

Upper abdomen: Punctate (approximately 0.6 cm) nonenhancing lesion
within the dome of the right lobe of the liver (image 47, series 9),
is too small to adequately characterize though is unchanged compared
to the [DATE] examination and favored to represent a hepatic cyst.

Musculoskeletal: Redemonstrated approximately 2.3 x 1.1 cm
subcutaneous nodule within the midline of the upper back (image 32,
series 9), similar to the 2020 examination and thus favored to
represent a sebaceous cyst. Normal appearance of the thyroid gland.
IMPRESSION: Stable uncomplicated mild fusiform aneurysmal dilatation of the
ascending thoracic aorta measuring 40 mm in diameter, grossly
unchanged compared to the 5454 examination. Aortic aneurysm NOS
([id number]).

## 2022-07-05 ENCOUNTER — Ambulatory Visit: Payer: Medicare Other | Admitting: Interventional Cardiology

## 2022-07-21 ENCOUNTER — Ambulatory Visit: Payer: Medicare Other | Attending: Interventional Cardiology

## 2022-07-21 DIAGNOSIS — E7800 Pure hypercholesterolemia, unspecified: Secondary | ICD-10-CM | POA: Diagnosis not present

## 2022-07-21 LAB — LIPID PANEL
Chol/HDL Ratio: 4.1 ratio (ref 0.0–5.0)
Cholesterol, Total: 175 mg/dL (ref 100–199)
HDL: 43 mg/dL (ref >39)
LDL Chol Calc (NIH): 104 mg/dL — ABNORMAL HIGH (ref 0–99)
Triglycerides: 161 mg/dL — ABNORMAL HIGH (ref 0–149)
VLDL Cholesterol Cal: 28 mg/dL (ref 5–40)

## 2022-07-21 LAB — HEPATIC FUNCTION PANEL
ALT: 13 IU/L (ref 0–44)
AST: 25 IU/L (ref 0–40)
Albumin: 4.6 g/dL (ref 3.8–4.8)
Alkaline Phosphatase: 68 IU/L (ref 44–121)
Bilirubin Total: 0.6 mg/dL (ref 0.0–1.2)
Bilirubin, Direct: 0.17 mg/dL (ref 0.00–0.40)
Total Protein: 6.5 g/dL (ref 6.0–8.5)

## 2022-07-29 ENCOUNTER — Telehealth: Payer: Self-pay | Admitting: *Deleted

## 2022-07-29 DIAGNOSIS — E7800 Pure hypercholesterolemia, unspecified: Secondary | ICD-10-CM

## 2022-07-29 NOTE — Telephone Encounter (Signed)
Patient notified.  He thinks he had problems with Crestor in the past.  Chart reviewed and 04/20/13 office visit note from [facility], [facility] indicates patient is intolerant to Crestor and Lipitor.

## 2022-07-29 NOTE — Telephone Encounter (Signed)
-----   Message from Jettie Booze, MD sent at 07/22/2022  1:35 PM EST ----- LDL above target.  LFTs ok.  Would stop simvastatin and start rosuvastatin 20 mg daily.  Liver and lipid tests in 3 months

## 2022-08-02 ENCOUNTER — Telehealth: Payer: Self-pay

## 2022-08-02 ENCOUNTER — Telehealth: Payer: Self-pay | Admitting: *Deleted

## 2022-08-02 DIAGNOSIS — Z7901 Long term (current) use of anticoagulants: Secondary | ICD-10-CM | POA: Diagnosis not present

## 2022-08-02 DIAGNOSIS — Z86010 Personal history of colonic polyps: Secondary | ICD-10-CM | POA: Diagnosis not present

## 2022-08-02 DIAGNOSIS — I2510 Atherosclerotic heart disease of native coronary artery without angina pectoris: Secondary | ICD-10-CM | POA: Diagnosis not present

## 2022-08-02 NOTE — Telephone Encounter (Signed)
Pt has been scheduled for tele pre op appt 08/12/22. Med rec and consent are done.

## 2022-08-02 NOTE — Telephone Encounter (Signed)
   Name: Chad Savage  DOB: 18-Oct-1950  MRN: [id number]  Primary Cardiologist: Larae Grooms, MD   Preoperative team, please contact this patient and set up a phone call appointment for further preoperative risk assessment. Please obtain consent and complete medication review. Thank you for your help.  I confirm that guidance regarding antiplatelet and oral anticoagulation therapy has been completed and, if necessary, noted below.  His aspirin and Plavix may be held for 5 to 7 days prior to his procedure.  Please resume as soon as hemostasis is achieved.   Deberah Pelton, NP 08/02/2022, 3:34 PM [place]

## 2022-08-02 NOTE — Telephone Encounter (Signed)
Jettie Booze, MD     Yes. Can refer to lipid clinic   I spoke with patient.  He would like to proceed with referral.  Appointment made with pharmacist for August 06, 2022 at 8:30

## 2022-08-02 NOTE — Telephone Encounter (Signed)
Pt has been scheduled for tele pre op appt 08/12/22. Med rec and consent are done.     Patient Consent for Virtual Visit        Chad Savage has provided verbal consent on 08/02/2022 for a virtual visit (video or telephone).   CONSENT FOR VIRTUAL VISIT FOR:  Chad Savage  By participating in this virtual visit I agree to the following:  I hereby voluntarily request, consent and authorize [facility] and its employed or contracted physicians, physician assistants, nurse practitioners or other licensed health care professionals (the Practitioner), to provide me with telemedicine health care services (the "Services") as deemed necessary by the treating Practitioner. I acknowledge and consent to receive the Services by the Practitioner via telemedicine. I understand that the telemedicine visit will involve communicating with the Practitioner through live audiovisual communication technology and the disclosure of certain medical information by electronic transmission. I acknowledge that I have been given the opportunity to request an in-person assessment or other available alternative prior to the telemedicine visit and am voluntarily participating in the telemedicine visit.  I understand that I have the right to withhold or withdraw my consent to the use of telemedicine in the course of my care at any time, without affecting my right to future care or treatment, and that the Practitioner or I may terminate the telemedicine visit at any time. I understand that I have the right to inspect all information obtained and/or recorded in the course of the telemedicine visit and may receive copies of available information for a reasonable fee.  I understand that some of the potential risks of receiving the Services via telemedicine include:  Delay or interruption in medical evaluation due to technological equipment failure or disruption; Information transmitted may not be sufficient (e.g. poor  resolution of images) to allow for appropriate medical decision making by the Practitioner; and/or  In rare instances, security protocols could fail, causing a breach of personal health information.  Furthermore, I acknowledge that it is my responsibility to provide information about my medical history, conditions and care that is complete and accurate to the best of my ability. I acknowledge that Practitioner's advice, recommendations, and/or decision may be based on factors not within their control, such as incomplete or inaccurate data provided by me or distortions of diagnostic images or specimens that may result from electronic transmissions. I understand that the practice of medicine is not an exact science and that Practitioner makes no warranties or guarantees regarding treatment outcomes. I acknowledge that a copy of this consent can be made available to me via my patient portal ([place]), or I can request a printed copy by calling the office of [facility].    I understand that my insurance will be billed for this visit.   I have read or had this consent read to me. I understand the contents of this consent, which adequately explains the benefits and risks of the Services being provided via telemedicine.  I have been provided ample opportunity to ask questions regarding this consent and the Services and have had my questions answered to my satisfaction. I give my informed consent for the services to be provided through the use of telemedicine in my medical care

## 2022-08-02 NOTE — Telephone Encounter (Signed)
   Pre-operative Risk Assessment    Patient Name: Chad Savage  DOB: [birth year] MRN: [id number]      Request for Surgical Clearance    Procedure:   COLONOSCOPY   Date of Surgery:  Clearance 08/24/22                                 Surgeon:  DR. Paulita Fujita Surgeon's Group or Practice Name:  [facility] Phone number:  [phone] Fax number:  [phone]   Type of Clearance Requested:   - Pharmacy:  Hold Aspirin and Clopidogrel (Plavix) 5 DAYS PRIOR   Type of Anesthesia:   PROPOFOL   Additional requests/questions:    SignedJacinta Shoe   08/02/2022, 2:28 PM

## 2022-08-06 ENCOUNTER — Encounter: Payer: Self-pay | Admitting: Pharmacist

## 2022-08-06 ENCOUNTER — Ambulatory Visit: Payer: Medicare Other | Attending: Internal Medicine | Admitting: Pharmacist

## 2022-08-06 DIAGNOSIS — E7800 Pure hypercholesterolemia, unspecified: Secondary | ICD-10-CM

## 2022-08-06 MED ORDER — EZETIMIBE-SIMVASTATIN 10-40 MG PO TABS: 1.0000 | ORAL_TABLET | Freq: Every day | ORAL | 3 refills | Status: DC

## 2022-08-06 NOTE — Progress Notes (Signed)
Patient ID: [name]                 DOB: [birth year]                    MRN: [id number]     HPI: Chad Savage is a 72 y.o. male patient referred to lipid clinic by Dr Irish Lack. PMH is significant for MI, multivessel stents to LAD and RCA in 2008, HLD, thoracic aortic aneurysm, and preDM.  Pt previously took Saxton in 2014 which brought his LDL down to 54. Med was branded at the time and his insurance stopped covering it so he was changed to simvastatin and has been taking that for the last decade. TOlerating well when he takes with CoQ10. Previously experienced muscle weakness and joint pain on both Crestor and Lipitor.  Pt reports adherence to simvastatin but LDL has fluctuated from 80s-100 over the past few years. He feels as though simvastatin is not working well anymore since his cholesterol has gone up despite him losing ~22 lbs.  Current Medications: simvastatin '40mg'$  daily Intolerances: rosuvastatin, atorvastatin - muscle weakness and joint pain Risk Factors: premature CAD, FHx premature CAD LDL goal: '55mg'$ /dL  Diet: limits red meat, likes chicken and fish, no fried food.  Exercise: Walks 30 minutes a day and exercises 30 minutes a day.   Family History: Mother had MI in her 70s, father had MI at 30  Social History: The patient  reports that he has never smoked. He has never used smokeless tobacco. He reports that he does not drink alcohol and does not use drugs.    Labs: 07/21/22: TC 175, TG 161, HDL 43, LDL 104 (simvastatin '40mg'$  daily) 04/20/13: TC 107, TG 86, HDL 36, LDL 54 (Vytorin 10-'40mg'$  daily)  Past Medical History:  Diagnosis Date   Coronary artery disease 2008   Hypercholesteremia    Myocardial infarction [facility])    Thoracic aneurysm March 2013   4 cm without complicating features    Current Outpatient Medications on File Prior to Visit  Medication Sig Dispense Refill   Ascorbic Acid (VITAMIN C) 500 MG CHEW      aspirin 81 MG tablet Take 81 mg by mouth every  other day.      clopidogrel (PLAVIX) 75 MG tablet TAKE 1 TABLET BY MOUTH DAILY 100 tablet 1   Coenzyme Q10 (CO Q-10) 300 MG CAPS Take 300 mg by mouth daily.     Collagen-Vitamin C (COLLAGEN PLUS VITAMIN C PO)      cyclobenzaprine (FLEXERIL) 10 MG tablet SMARTSIG:1 Tablet(s) By Mouth 1-3 Times Daily PRN (Patient not taking: Reported on 08/02/2022)     Glucosamine-Chondroit-Vit C-Mn (GLUCOSAMINE CHONDR 1500 COMPLX PO) Take 1 tablet by mouth 2 (two) times daily. 1500-1200     Multiple Vitamins-Minerals (CENTRUM PO) Take 1 tablet by mouth daily.      nitroGLYCERIN (NITROSTAT) 0.4 MG SL tablet Place 1 tablet (0.4 mg total) under the tongue every 5 (five) minutes as needed for chest pain. 25 tablet 3   Omega-3 Fatty Acids (FISH OIL) 1200 MG CAPS Take 1,200 mg by mouth 2 (two) times daily.      POTASSIUM BICARB & CHLORIDE PO Take by mouth every other day.     sildenafil (REVATIO) 20 MG tablet TAKE 3 TO 5 TABLETS BY MOUTH ONE HOUR PRIOR TO SEXUAL ACTIVITY AS NEEDED . APPOINTMENT REQUIRED FOR FUTURE REFILLS 30 tablet 0   simvastatin (ZOCOR) 40 MG tablet TAKE 1 TABLET BY  MOUTH DAILY 100 tablet 1   No current facility-administered medications on file prior to visit.    Allergies  Allergen Reactions   Meperidine Other (See Comments)    Cold sweats Shakes Cold sweats   Crestor [Rosuvastatin]     Muscle pain   Lipitor [Atorvastatin]    Pseudoephedrine Hcl Itching   Antihistamines, Chlorpheniramine-Type Rash        Sudafed [Pseudoephedrine Hcl] Rash    Assessment/Plan:  1. Hyperlipidemia - LDL goal < 55 due to premature ASCVD. Previously had LDL of 54 on Vytorin about a decade ago, med changed to simvastatin due to lack of insurance coverage. Intolerant to Crestor and Lipitor. Vytorin is free now on his formulary, will resume Vytorin 10-'40mg'$  daily and recheck lipids/LFTs in 6-8 weeks. He'll continue on CoQ10 which has helped him tolerate his statin. Discussed Repatha as well however pt does not wish  to pursue due to anticipated copay.  Chad Savage, PharmD, BCACP, [facility]. [address] [place], [place] [address] Phone: [phone]; Fax: [phone] 08/06/2022 9:15 AM

## 2022-08-06 NOTE — Patient Instructions (Addendum)
Your LDL cholesterol is 104 and your goal is < 55  Start Vytorin (simvastatin-ezetimibe) - 1 tablet daily  Continue taking your CoQ10  Recheck fasting cholesterol on Monday, April 22nd any time after 7:30am

## 2022-08-12 ENCOUNTER — Ambulatory Visit: Payer: Medicare Other | Attending: Internal Medicine | Admitting: Nurse Practitioner

## 2022-08-12 DIAGNOSIS — Z01810 Encounter for preprocedural cardiovascular examination: Secondary | ICD-10-CM

## 2022-08-12 NOTE — Progress Notes (Signed)
Virtual Visit via Telephone Note   Because of Chad Savage's co-morbid illnesses, he is at least at moderate risk for complications without adequate follow up.  This format is felt to be most appropriate for this patient at this time.  The patient did not have access to video technology/had technical difficulties with video requiring transitioning to audio format only (telephone).  All issues noted in this document were discussed and addressed.  No physical exam could be performed with this format.  Please refer to the patient's chart for his consent to telehealth for [facility].  Evaluation Performed:  Preoperative cardiovascular risk assessment _____________   Date:  08/12/2022   Patient ID:  Chad Savage, Chad Savage [birth year], MRN [id number] Patient Location:  Home Provider location:   Office  Primary Care Provider:  Leighton Ruff, MD (Inactive) Primary Cardiologist:  Larae Grooms, MD  Chief Complaint / Patient Profile   72 y.o. y/o male with a h/o CAD s/p DES-LAD and RCA, sick aortic aneurysm, prediabetes, hyperlipidemia, and DVT who is pending colonoscopy on 08/24/2022 with Dr. Paulita Fujita of [facility] and presents today for telephonic preoperative cardiovascular risk assessment.  History of Present Illness    Chad Savage is a 72 y.o. male who presents via audio/video conferencing for a telehealth visit today.  Pt was last seen in cardiology clinic on 04/19/2022 by Dr. Irish Lack.  At that time Marvie A Gassert was doing well.  The patient is now pending procedure as outlined above. Since his last visit, he has done well from a cardiac standpoint.   He denies chest pain, palpitations, dyspnea, pnd, orthopnea, n, v, dizziness, syncope, edema, weight gain, or early satiety. All other systems reviewed and are otherwise negative except as noted above.   Past Medical History    Past Medical History:  Diagnosis Date   Coronary artery disease 2008   Hypercholesteremia     Myocardial infarction [facility])    Thoracic aneurysm March 2013   4 cm without complicating features   Past Surgical History:  Procedure Laterality Date   CARDIAC CATHETERIZATION  2008   CORONARY ANGIOPLASTY  2008-August   second stent placed   HYDROCELE EXCISION  06/20/2012   Procedure: HYDROCELECTOMY ADULT;  Surgeon: Malka So, MD;  Location: [facility];  Service: Urology;  Laterality: Bilateral;    Allergies  Allergies  Allergen Reactions   Meperidine Other (See Comments)    Cold sweats Shakes Cold sweats   Crestor [Rosuvastatin]     Muscle pain   Lipitor [Atorvastatin]    Pseudoephedrine Hcl Itching   Antihistamines, Chlorpheniramine-Type Rash        Sudafed [Pseudoephedrine Hcl] Rash    Home Medications    Prior to Admission medications   Medication Sig Start Date End Date Taking? Authorizing Provider  Ascorbic Acid (VITAMIN C) 500 MG CHEW  04/13/19   [provider]  aspirin 81 MG tablet Take 81 mg by mouth every other day.     [provider]  clopidogrel (PLAVIX) 75 MG tablet TAKE 1 TABLET BY MOUTH DAILY 05/06/22   Jettie Booze, MD  Coenzyme Q10 (CO Q-10) 300 MG CAPS Take 300 mg by mouth daily.    [provider]  Collagen-Vitamin C (COLLAGEN PLUS VITAMIN C PO)  01/24/21   [provider]  ezetimibe-simvastatin (VYTORIN) 10-40 MG tablet Take 1 tablet by mouth daily. 08/06/22   Jettie Booze, MD  Glucosamine-Chondroit-Vit C-Mn (GLUCOSAMINE CHONDR 1500 COMPLX PO) Take  1 tablet by mouth 2 (two) times daily. 1500-1200    [provider]  Multiple Vitamins-Minerals (CENTRUM PO) Take 1 tablet by mouth daily.     [provider]  nitroGLYCERIN (NITROSTAT) 0.4 MG SL tablet Place 1 tablet (0.4 mg total) under the tongue every 5 (five) minutes as needed for chest pain. 10/22/15   Jettie Booze, MD  Omega-3 Fatty Acids (FISH OIL) 1200 MG CAPS Take 1,200 mg by mouth 2 (two) times daily.      [provider]  POTASSIUM BICARB & CHLORIDE PO Take by mouth every other day.    [provider]  sildenafil (REVATIO) 20 MG tablet TAKE 3 TO 5 TABLETS BY MOUTH ONE HOUR PRIOR TO SEXUAL ACTIVITY AS NEEDED . APPOINTMENT REQUIRED FOR FUTURE REFILLS 05/14/22   Jettie Booze, MD    Physical Exam    Vital Signs:  Neev A Gerstel does not have vital signs available for review today.  [name], physical exam is limited. AAOx3. NAD. Normal affect.  Speech and respirations are unlabored.  Accessory Clinical Findings    None  Assessment & Plan    1.  Preoperative Cardiovascular Risk Assessment:  According to the Revised Cardiac Risk Index (RCRI), his Perioperative Risk of Major Cardiac Event is (%): 0.9. His Functional Capacity in METs is: 7.01 according to the Duke Activity Status Index (DASI). Therefore, based on ACC/AHA guidelines, patient would be at acceptable risk for the planned procedure without further cardiovascular testing.   The patient was advised that if he develops new symptoms prior to surgery to contact our office to arrange for a follow-up visit, and he verbalized understanding.  Per office protocol, he may hold Aspirin for 5-7 days prior to procedure. He may hold Plavix 5 days prior to procedure. Please resume Aspirin and Plavix as soon as possible postprocedure, at the discretion of the surgeon.    A copy of this note will be routed to requesting surgeon.  Time:   Today, I have spent 5 minutes with the patient with telehealth technology discussing medical history, symptoms, and management plan.     Lenna Sciara, NP  08/12/2022, 9:08 AM

## 2022-09-03 ENCOUNTER — Other Ambulatory Visit: Payer: Self-pay | Admitting: Interventional Cardiology

## 2022-09-03 NOTE — Telephone Encounter (Signed)
Pt's pharmacy is requesting a refill on sildenafil. Would Dr. Varanasi like to refill this medication? Please address 

## 2022-09-06 NOTE — Telephone Encounter (Signed)
Refill sent to pharmacy.   

## 2022-09-20 ENCOUNTER — Ambulatory Visit: Payer: Medicare Other

## 2022-09-27 ENCOUNTER — Ambulatory Visit: Payer: Medicare Other | Attending: Interventional Cardiology

## 2022-09-27 DIAGNOSIS — E7800 Pure hypercholesterolemia, unspecified: Secondary | ICD-10-CM | POA: Diagnosis not present

## 2022-09-28 LAB — HEPATIC FUNCTION PANEL
ALT: 13 IU/L (ref 0–44)
AST: 28 IU/L (ref 0–40)
Albumin: 4.5 g/dL (ref 3.8–4.8)
Alkaline Phosphatase: 65 IU/L (ref 44–121)
Bilirubin Total: 0.6 mg/dL (ref 0.0–1.2)
Bilirubin, Direct: 0.17 mg/dL (ref 0.00–0.40)
Total Protein: 6.4 g/dL (ref 6.0–8.5)

## 2022-09-28 LAB — LIPID PANEL
Chol/HDL Ratio: 2.9 ratio (ref 0.0–5.0)
Cholesterol, Total: 144 mg/dL (ref 100–199)
HDL: 49 mg/dL (ref >39)
LDL Chol Calc (NIH): 76 mg/dL (ref 0–99)
Triglycerides: 102 mg/dL (ref 0–149)
VLDL Cholesterol Cal: 19 mg/dL (ref 5–40)

## 2022-10-11 DIAGNOSIS — M5442 Lumbago with sciatica, left side: Secondary | ICD-10-CM | POA: Diagnosis not present

## 2022-10-11 DIAGNOSIS — M5441 Lumbago with sciatica, right side: Secondary | ICD-10-CM | POA: Diagnosis not present

## 2022-10-11 DIAGNOSIS — Z0000 Encounter for general adult medical examination without abnormal findings: Secondary | ICD-10-CM | POA: Diagnosis not present

## 2022-10-11 DIAGNOSIS — L309 Dermatitis, unspecified: Secondary | ICD-10-CM | POA: Diagnosis not present

## 2022-10-19 ENCOUNTER — Other Ambulatory Visit: Payer: Self-pay | Admitting: Interventional Cardiology

## 2022-10-22 DIAGNOSIS — Z09 Encounter for follow-up examination after completed treatment for conditions other than malignant neoplasm: Secondary | ICD-10-CM | POA: Diagnosis not present

## 2022-10-22 DIAGNOSIS — Z86010 Personal history of colonic polyps: Secondary | ICD-10-CM | POA: Diagnosis not present

## 2022-10-22 DIAGNOSIS — K5520 Angiodysplasia of colon without hemorrhage: Secondary | ICD-10-CM | POA: Diagnosis not present

## 2022-10-22 DIAGNOSIS — K5730 Diverticulosis of large intestine without perforation or abscess without bleeding: Secondary | ICD-10-CM | POA: Diagnosis not present

## 2022-11-20 DIAGNOSIS — M5441 Lumbago with sciatica, right side: Secondary | ICD-10-CM | POA: Diagnosis not present

## 2022-12-29 ENCOUNTER — Other Ambulatory Visit: Payer: Self-pay | Admitting: Interventional Cardiology

## 2023-01-28 DIAGNOSIS — H1031 Unspecified acute conjunctivitis, right eye: Secondary | ICD-10-CM | POA: Diagnosis not present

## 2023-03-11 DIAGNOSIS — I25119 Atherosclerotic heart disease of native coronary artery with unspecified angina pectoris: Secondary | ICD-10-CM | POA: Diagnosis not present

## 2023-03-11 DIAGNOSIS — M79661 Pain in right lower leg: Secondary | ICD-10-CM | POA: Diagnosis not present

## 2023-03-17 ENCOUNTER — Other Ambulatory Visit: Payer: Self-pay | Admitting: Interventional Cardiology

## 2023-03-17 NOTE — Telephone Encounter (Signed)
Pt's pharmacy is requesting a refill on sildenafil. Would the provider like to refill this non cardiac medication? Please address

## 2023-03-30 ENCOUNTER — Telehealth: Payer: Self-pay | Admitting: Nurse Practitioner

## 2023-03-30 MED ORDER — SILDENAFIL CITRATE 20 MG PO TABS: ORAL_TABLET | ORAL | 0 refills | Status: DC

## 2023-03-30 NOTE — Telephone Encounter (Signed)
Left detailed message informing the patient that we will send over his rx as long as he is not currently using the Sildenafil.   Advised a call back with any questions or concerns.   Ok per [place] to send in a ninety day supply.   Rx(s) sent to pharmacy electronically.

## 2023-03-30 NOTE — Telephone Encounter (Signed)
Pt has an upcoming appt in November with Robin Searing, NP. Would provider like to refill medication sildenafil? Please address

## 2023-03-30 NOTE — Telephone Encounter (Signed)
*  STAT* If patient is at the pharmacy, call can be transferred to refill team.   1. Which medications need to be refilled? (please list name of each medication and dose if known) sildenafil (REVATIO) 20 MG tablet   2. Which pharmacy/location (including street and city if local pharmacy) is medication to be sent to? [facility] [address] [place], [place] - [address]   3. Do they need a 30 day or 90 day supply? 90

## 2023-03-30 NOTE — Telephone Encounter (Signed)
Gaston Islam., NP  You1 hour ago (11:40 AM)    We can refill medication as long as patient is not using nitroglycerin currently.  If he is not using nitroglycerin and is not having any current cardiac problems we can go ahead and refill.  Thanks, Robin Searing, NP

## 2023-04-20 NOTE — Progress Notes (Unsigned)
Cardiology Office Note    Patient Name: Chad Savage Date of Encounter: 04/20/2023  Primary Care Provider:  Juluis Rainier, MD (Inactive) Primary Cardiologist:  Lance Muss, MD Primary Electrophysiologist: None   Past Medical History    Past Medical History:  Diagnosis Date   Coronary artery disease 2008   Hypercholesteremia    Myocardial infarction [facility])    Thoracic aneurysm March 2013   4 cm without complicating features    History of Present Illness  Chad Savage is a 72 y.o. male with a PMH of CAD s/p NSTEMI with DES to mid LAD and mid RCA 08/2006, HLD, thoracic aneurysm, prediabetes, who presents today for 1 year follow-up.  Mr. Hinton was previously followed by Dr. Ty Hilts and was recently followed by Dr. Eldridge Dace until 2024.  He suffered an NSTEMI in 2008 with LHC performed and DES to mid LAD and mid RCA.  He was diagnosed with thoracic aneurysm and most recent MRI completed 04/2022 showing stable aortic size.  He was last seen by Dr. Eldridge Dace on 04/19/2022 and reported doing well with maintaining activity such as walking, push-ups but was limited due to joint pain.  The patient, with a history of heart problems and currently on simvastatin for cholesterol management, presents with a slow heart rate and leg cramping. The leg cramping has been ongoing for the last three to four weeks and is particularly noticeable when the patient walks or squats. The patient also mentions a sensation in their shin during these activities. The patient has a history of injury to the same leg. The patient has been managing the leg cramping with Aleve and has noticed some improvement with continued walking and exercise. The patient also mentions using a heating pad and cold pack for relief.  He is scheduled to follow-up with [facility] for consultation regarding his lower extremity discomfort.  Patient denies chest pain, palpitations, dyspnea, PND, orthopnea, nausea, vomiting, dizziness, syncope,  edema, weight gain, or early satiety.  Review of Systems  Please see the history of present illness.    All other systems reviewed and are otherwise negative except as noted above.  Physical Exam    Wt Readings from Last 3 Encounters:  04/19/22 169 lb 3.2 oz (76.7 kg)  04/07/21 178 lb (80.7 kg)  11/12/19 170 lb 6.4 oz (77.3 kg)   ZO:XWRUE were no vitals filed for this visit.,There is no height or weight on file to calculate BMI. GEN: Well nourished, well developed in no acute distress Neck: No JVD; No carotid bruits Pulmonary: Clear to auscultation without rales, wheezing or rhonchi  Cardiovascular: Normal rate. Regular rhythm. Normal S1. Normal S2.   Murmurs: There is no murmur.  ABDOMEN: Soft, non-tender, non-distended EXTREMITIES:  No edema; right lower extremity shin pain and occasional cramping  EKG/LABS/ Recent Cardiac Studies   ECG personally reviewed by me today -sinus bradycardia with a rate of 52 bpm and no acute changes consistent with previous EKG.  Risk Assessment/Calculations:          Lab Results  Component Value Date   WBC 5.1 01/03/2007   HGB 16.7 06/20/2012   HCT 49.0 06/20/2012   MCV 87.7 01/03/2007   PLT 188 01/03/2007   Lab Results  Component Value Date   CREATININE 1.02 01/18/2020   BUN 16 01/18/2020   NA 139 01/18/2020   K 4.2 01/18/2020   CL 102 01/18/2020   CO2 24 01/18/2020   Lab Results  Component Value Date   CHOL 144  09/27/2022   HDL 49 09/27/2022   LDLCALC 76 09/27/2022   TRIG 102 09/27/2022   CHOLHDL 2.9 09/27/2022    Lab Results  Component Value Date   HGBA1C 5.7 (H) 04/19/2022   Assessment & Plan    1.  Coronary artery disease: -s/p NSTEMI 2008 with PCI to LAD and RCA with no acute changes -Today patient reports no chest pain or shortness of breath since previous visit. -Continue current GDMT with ASA 81 mg, Plavix 75 mg Vytorin 10/40 mg daily  2.  Hypercholesteremia: Patient currently taking simvastatin alone, not  Vytorin. LDL slightly above goal in April. -Continue simvastatin 40mg  daily. -Check lipid panel today. If LDL remains above goal, consider medication adjustment.  3.  Ascending aortic aneurysm: -Most recent MRI completed 2023 showing stable aorta -We will repeat MRI of in 2024  4.  Bradycardia Heart rate of 52 beats per minute, consistent with previous EKGs. No evidence of heart block or other abnormalities. Patient is asymptomatic and functioning well. -Continue current management.  Disposition: Follow-up with Lance Muss, MD or APP in 12 months   Signed, Napoleon Form, Leodis Rains, NP 04/20/2023, 10:04 AM [facility]

## 2023-04-21 ENCOUNTER — Encounter: Payer: Self-pay | Admitting: Nurse Practitioner

## 2023-04-21 ENCOUNTER — Ambulatory Visit: Payer: Medicare Other | Attending: Nurse Practitioner | Admitting: Nurse Practitioner

## 2023-04-21 ENCOUNTER — Other Ambulatory Visit: Payer: Self-pay | Admitting: Interventional Cardiology

## 2023-04-21 VITALS — BP 114/68 | HR 100 | Ht 64.0 in | Wt 163.2 lb

## 2023-04-21 DIAGNOSIS — I25119 Atherosclerotic heart disease of native coronary artery with unspecified angina pectoris: Secondary | ICD-10-CM | POA: Diagnosis not present

## 2023-04-21 DIAGNOSIS — I7121 Aneurysm of the ascending aorta, without rupture: Secondary | ICD-10-CM

## 2023-04-21 DIAGNOSIS — R001 Bradycardia, unspecified: Secondary | ICD-10-CM

## 2023-04-21 DIAGNOSIS — E7800 Pure hypercholesterolemia, unspecified: Secondary | ICD-10-CM | POA: Diagnosis not present

## 2023-04-21 DIAGNOSIS — N529 Male erectile dysfunction, unspecified: Secondary | ICD-10-CM

## 2023-04-21 NOTE — Patient Instructions (Signed)
Medication Instructions:  Your physician recommends that you continue on your current medications as directed. Please refer to the Current Medication list given to you today. *If you need a refill on your cardiac medications before your next appointment, please call your pharmacy*   Lab Work: TODAY-BMET, LIPIDS, LFT AND MAG If you have labs (blood work) drawn today and your tests are completely normal, you will receive your results only by: MyChart Message (if you have MyChart) OR A paper copy in the mail If you have any lab test that is abnormal or we need to change your treatment, we will call you to review the results.   Testing/Procedures: NONE ORDERED   Follow-Up: At [facility], you and your health needs are our priority.  As part of our continuing mission to provide you with exceptional heart care, we have created designated Provider Care Teams.  These Care Teams include your primary Cardiologist (physician) and Advanced Practice Providers (APPs -  Physician Assistants and Nurse Practitioners) who all work together to provide you with the care you need, when you need it.  We recommend signing up for the patient portal called "MyChart".  Sign up information is provided on this After Visit Summary.  MyChart is used to connect with patients for Virtual Visits (Telemedicine).  Patients are able to view lab/test results, encounter notes, upcoming appointments, etc.  Non-urgent messages can be sent to your provider as well.   To learn more about what you can do with MyChart, go to [email or web address].    Your next appointment:   12 month(s)  Provider:   Verne Carrow, MD  Other Instructions

## 2023-04-22 LAB — LIPID PANEL
Chol/HDL Ratio: 3.5 ratio (ref 0.0–5.0)
Cholesterol, Total: 149 mg/dL (ref 100–199)
HDL: 43 mg/dL (ref >39)
LDL Chol Calc (NIH): 83 mg/dL (ref 0–99)
Triglycerides: 131 mg/dL (ref 0–149)
VLDL Cholesterol Cal: 23 mg/dL (ref 5–40)

## 2023-04-22 LAB — HEPATIC FUNCTION PANEL
ALT: 9 [IU]/L (ref 0–44)
AST: 23 [IU]/L (ref 0–40)
Albumin: 4.4 g/dL (ref 3.8–4.8)
Alkaline Phosphatase: 65 [IU]/L (ref 44–121)
Bilirubin Total: 0.5 mg/dL (ref 0.0–1.2)
Bilirubin, Direct: 0.18 mg/dL (ref 0.00–0.40)
Total Protein: 6.3 g/dL (ref 6.0–8.5)

## 2023-04-22 LAB — BASIC METABOLIC PANEL
BUN/Creatinine Ratio: 15 (ref 10–24)
BUN: 15 mg/dL (ref 8–27)
CO2: 25 mmol/L (ref 20–29)
Calcium: 9.2 mg/dL (ref 8.6–10.2)
Chloride: 104 mmol/L (ref 96–106)
Creatinine, Ser: 1 mg/dL (ref 0.76–1.27)
Glucose: 83 mg/dL (ref 70–99)
Potassium: 4.9 mmol/L (ref 3.5–5.2)
Sodium: 142 mmol/L (ref 134–144)
eGFR: 79 mL/min/{1.73_m2} (ref >59)

## 2023-04-22 LAB — MAGNESIUM: Magnesium: 2 mg/dL (ref 1.6–2.3)

## 2023-05-09 DIAGNOSIS — Z0000 Encounter for general adult medical examination without abnormal findings: Secondary | ICD-10-CM | POA: Diagnosis not present

## 2023-05-09 DIAGNOSIS — R7303 Prediabetes: Secondary | ICD-10-CM | POA: Diagnosis not present

## 2023-05-17 ENCOUNTER — Other Ambulatory Visit: Payer: Self-pay | Admitting: *Deleted

## 2023-05-17 DIAGNOSIS — M79604 Pain in right leg: Secondary | ICD-10-CM

## 2023-05-17 DIAGNOSIS — Z0000 Encounter for general adult medical examination without abnormal findings: Secondary | ICD-10-CM | POA: Diagnosis not present

## 2023-05-19 ENCOUNTER — Encounter: Payer: Medicare Other | Admitting: Vascular Surgery

## 2023-05-19 ENCOUNTER — Encounter (HOSPITAL_COMMUNITY): Payer: Medicare Other

## 2023-06-01 NOTE — Progress Notes (Signed)
 Office Note     CC:  Concern for right-sided claudicaton Requesting Provider:  Sheldon Netter, PA  HPI: Chad Savage is a 73 y.o. (July 02, 1950) male presenting at the request of .Gwenn Norris, MD (Inactive) for right lower extremity evaluation -concern for claudication  On exam today, Chad Savage was doing well.  A native of [place] , he moved to [place]  years ago.  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  In retirement, he continues to live an active lifestyle.  He plays multiple musical instruments including the banjo, and plays in a local band.  They travel around to various nursing homes to provide joy to patient's.  Several months ago, Chad Savage appreciated pain in the anterior portion of his right calf adjacent to the tibia.  He notes the pain mostly when walking, stating as long as he walks in a straight line, the pain is much less, but if he starts making turns he has severe anterior calf pain that is immediately adjacent to the tibia.  Over the last month, he has changed shoes, and has had significant improvement in his symptoms.  He denies history of posterior calf cramping, ischemic rest pain, tissue loss.  Denies bruising, denies wounds or wound healing difficulty.   The pt is  on a statin for cholesterol management.  The pt is  on a daily aspirin.   Other AC:  plavix    Past Medical History:  Diagnosis Date   Coronary artery disease 2008   Hypercholesteremia    Myocardial infarction [facility])    Thoracic aneurysm March 2013   4 cm without complicating features    Past Surgical History:  Procedure Laterality Date   CARDIAC CATHETERIZATION  2008   CORONARY ANGIOPLASTY  2008-August   second stent placed   HYDROCELE EXCISION  06/20/2012   Procedure: HYDROCELECTOMY ADULT;  Surgeon: Norleen JINNY Seltzer, MD;  Location: [facility];  Service: Urology;  Laterality: Bilateral;    Social History   Socioeconomic History   Marital status:  Married    Spouse name: Not on file   Number of children: Not on file   Years of education: Not on file   Highest education level: Not on file  Occupational History   Not on file  Tobacco Use   Smoking status: Never   Smokeless tobacco: Never  Vaping Use   Vaping status: Never Used  Substance and Sexual Activity   Alcohol use: No   Drug use: No   Sexual activity: Not on file  Other Topics Concern   Not on file  Social History Narrative   Not on file   Social Drivers of Health   Financial Resource Strain: Not on file  Food Insecurity: Not on file  Transportation Needs: Not on file  Physical Activity: Not on file  Stress: Not on file  Social Connections: Not on file  Intimate Partner Violence: Not on file   Family History  Problem Relation Age of Onset   Heart attack Father    Heart attack Brother    Heart Problems Unknown        FAMILY HISTORY    Current Outpatient Medications  Medication Sig Dispense Refill   Ascorbic Acid (VITAMIN C) 500 MG CHEW      aspirin 81 MG tablet Take 81 mg by mouth every other day.      clopidogrel  (PLAVIX ) 75 MG tablet TAKE 1 TABLET BY MOUTH DAILY 100 tablet 2   Coenzyme Q10 (  CO Q-10) 300 MG CAPS Take 300 mg by mouth daily.     Collagen-Vitamin C (COLLAGEN PLUS VITAMIN C PO)      ezetimibe -simvastatin  (VYTORIN ) 10-40 MG tablet Take 1 tablet by mouth daily. 90 tablet 3   Glucosamine-Chondroit-Vit C-Mn (GLUCOSAMINE CHONDR 1500 COMPLX PO) Take 1 tablet by mouth 2 (two) times daily. 1500-1200     Multiple Vitamins-Minerals (CENTRUM PO) Take 1 tablet by mouth daily.      nitroGLYCERIN  (NITROSTAT ) 0.4 MG SL tablet Place 1 tablet (0.4 mg total) under the tongue every 5 (five) minutes as needed for chest pain. 25 tablet 3   Omega-3 Fatty Acids (FISH OIL) 1200 MG CAPS Take 1,200 mg by mouth 2 (two) times daily.      POTASSIUM BICARB & CHLORIDE PO Take by mouth every other day.     sildenafil  (REVATIO ) 20 MG tablet TAKE 3 TO 5 TABLETS BY MOUTH ONE  HOUR PRIOR TO SEXUAL ACTIVITY AS NEEDED 90 tablet 0   No current facility-administered medications for this visit.    Allergies  Allergen Reactions   Meperidine Other (See Comments)    Cold sweats Shakes Cold sweats   Crestor [Rosuvastatin]     Muscle pain   Lipitor [Atorvastatin]    Pseudoephedrine Hcl Itching   Antihistamines, Chlorpheniramine-Type Rash        Sudafed [Pseudoephedrine Hcl] Rash     REVIEW OF SYSTEMS:  [X]  denotes positive finding, [ ]  denotes negative finding Cardiac  Comments:  Chest pain or chest pressure:    Shortness of breath upon exertion:    Short of breath when lying flat:    Irregular heart rhythm:        Vascular    Pain in calf, thigh, or hip brought on by ambulation:    Pain in feet at night that wakes you up from your sleep:     Blood clot in your veins:    Leg swelling:         Pulmonary    Oxygen at home:    Productive cough:     Wheezing:         Neurologic    Sudden weakness in arms or legs:     Sudden numbness in arms or legs:     Sudden onset of difficulty speaking or slurred speech:    Temporary loss of vision in one eye:     Problems with dizziness:         Gastrointestinal    Blood in stool:     Vomited blood:         Genitourinary    Burning when urinating:     Blood in urine:        Psychiatric    Major depression:         Hematologic    Bleeding problems:    Problems with blood clotting too easily:        Skin    Rashes or ulcers:        Constitutional    Fever or chills:      PHYSICAL EXAMINATION:  There were no vitals filed for this visit.  General:  WDWN in NAD; vital signs documented above Gait: Not observed HENT: WNL, normocephalic Pulmonary: normal non-labored breathing , without wheezing Cardiac: regular HR Abdomen: soft, NT, no masses Skin: without rashes Vascular Exam/Pulses:  Right Left  Radial 2+ (normal) 2+ (normal)  Ulnar    Femoral    Popliteal    DP 2+ (normal)  2+ (normal)   PT 2+ (normal) 2+ (normal)   Extremities: without ischemic changes, without Gangrene , without cellulitis; without open wounds;  Some cyanosis appreciated on the plantar aspect of the toes -asymptomatic Musculoskeletal: no muscle wasting or atrophy  Neurologic: A&O X 3;  No focal weakness or paresthesias are detected Psychiatric:  The pt has Normal affect.   Non-Invasive Vascular Imaging:     ABI Findings:  +---------+------------------+-----+-----------+--------+  Right   Rt Pressure (mmHg)IndexWaveform   Comment   +---------+------------------+-----+-----------+--------+  Brachial 134                                         +---------+------------------+-----+-----------+--------+  ATA     134               1.00 multiphasic          +---------+------------------+-----+-----------+--------+  PTA     138               1.03 multiphasic          +---------+------------------+-----+-----------+--------+  Great Toe77                0.57                      +---------+------------------+-----+-----------+--------+   +---------+------------------+-----+----------+----------+  Left    Lt Pressure (mmHg)IndexWaveform  Comment     +---------+------------------+-----+----------+----------+  Brachial 125                                          +---------+------------------+-----+----------+----------+  ATA     124               0.93 monophasicretrograde  +---------+------------------+-----+----------+----------+  PTA     144               1.07 triphasic             +---------+------------------+-----+----------+----------+  Great Toe0                 0.00                       +---------+------------------+-----+----------+----------+   +-------+-----------+-----------+------------+------------+  ABI/TBIToday's ABIToday's TBIPrevious ABIPrevious TBI  +-------+-----------+-----------+------------+------------+  Right  138        0.57                                 +-------+-----------+-----------+------------+------------+  Left  1.07       0                                    +-------+-----------+-----------+------------+------------+     ASSESSMENT/PLAN: Chad Savage is a 73 y.o. male presenting with anterior right calf pain.  On my assessment, the calf pain appears most consistent with shinsplints, and not consistent with claudication from arterial insufficiency.  I am happy his symptoms have improved with changing his shoes.  I congratulated him on living an active, independent lifestyle.  We discussed an orthopedic surgery referral, however with symptoms resolving, I think it is reasonable to hold off.  We also discussed the cyanosis appreciated in the toes.  While  the left great toe pressure was 0 today, I think this is a product of vasospasm.  He has had no issues with wound healing, and denies rest pain.  I asked him to call my office should nonhealing wounds develop, or if questions or concerns arise.   Fonda FORBES Rim, MD [facility] [phone]

## 2023-06-02 ENCOUNTER — Ambulatory Visit: Payer: Medicare Other | Admitting: Vascular Surgery

## 2023-06-02 ENCOUNTER — Ambulatory Visit (HOSPITAL_COMMUNITY)
Admission: RE | Admit: 2023-06-02 | Discharge: 2023-06-02 | Disposition: A | Discharge status: Home / Self Care | Payer: Medicare Other | Source: Ambulatory Visit | Attending: Vascular Surgery | Admitting: Vascular Surgery

## 2023-06-02 ENCOUNTER — Encounter: Payer: Self-pay | Admitting: Vascular Surgery

## 2023-06-02 VITALS — BP 133/78 | HR 64 | Temp 97.6°F | Resp 20 | Ht 64.0 in | Wt 163.0 lb

## 2023-06-02 DIAGNOSIS — S86899A Other injury of other muscle(s) and tendon(s) at lower leg level, unspecified leg, initial encounter: Secondary | ICD-10-CM

## 2023-06-02 DIAGNOSIS — R230 Cyanosis: Secondary | ICD-10-CM | POA: Diagnosis not present

## 2023-06-02 DIAGNOSIS — M79604 Pain in right leg: Secondary | ICD-10-CM | POA: Insufficient documentation

## 2023-06-02 LAB — VAS US ABI WITH/WO TBI
Left ABI: 1.07
Right ABI: 138

## 2023-06-17 ENCOUNTER — Other Ambulatory Visit: Payer: Self-pay | Admitting: Interventional Cardiology

## 2023-09-07 ENCOUNTER — Other Ambulatory Visit: Payer: Self-pay | Admitting: Interventional Cardiology

## 2023-09-09 DIAGNOSIS — J019 Acute sinusitis, unspecified: Secondary | ICD-10-CM | POA: Diagnosis not present

## 2023-10-26 ENCOUNTER — Other Ambulatory Visit (HOSPITAL_COMMUNITY): Payer: Self-pay

## 2023-10-26 MED ORDER — SILDENAFIL CITRATE 20 MG PO TABS
ORAL_TABLET | ORAL | 0 refills | Status: DC
  Filled 2023-10-26: qty 90, fill #0

## 2023-10-26 MED ORDER — SILDENAFIL CITRATE 20 MG PO TABS: ORAL_TABLET | ORAL | 0 refills | Status: DC

## 2024-03-18 ENCOUNTER — Other Ambulatory Visit: Payer: Self-pay | Admitting: Nurse Practitioner

## 2024-04-24 ENCOUNTER — Other Ambulatory Visit: Payer: Self-pay | Admitting: Nurse Practitioner

## 2024-04-25 NOTE — Telephone Encounter (Signed)
 Patient previously saw Dr Dann and is due for office visit with new provider.  I spoke with patient and scheduled him to see Dr Kriste on 12/3.  Refill sent to pharmacy for one month.  Patient will talk with Dr Kriste about continuing this medication and additional refills

## 2024-05-01 ENCOUNTER — Encounter: Payer: Self-pay | Admitting: *Deleted

## 2024-05-02 ENCOUNTER — Encounter: Payer: Self-pay | Admitting: Internal Medicine

## 2024-05-02 ENCOUNTER — Ambulatory Visit: Attending: Internal Medicine | Admitting: Internal Medicine

## 2024-05-02 ENCOUNTER — Other Ambulatory Visit: Payer: Self-pay

## 2024-05-02 VITALS — BP 109/60 | HR 55 | Ht 64.0 in | Wt 162.4 lb

## 2024-05-02 DIAGNOSIS — I7121 Aneurysm of the ascending aorta, without rupture: Secondary | ICD-10-CM | POA: Diagnosis not present

## 2024-05-02 DIAGNOSIS — I2510 Atherosclerotic heart disease of native coronary artery without angina pectoris: Secondary | ICD-10-CM

## 2024-05-02 DIAGNOSIS — M79604 Pain in right leg: Secondary | ICD-10-CM

## 2024-05-02 DIAGNOSIS — E7800 Pure hypercholesterolemia, unspecified: Secondary | ICD-10-CM

## 2024-05-02 LAB — LIPID PANEL
Chol/HDL Ratio: 5.1 ratio — ABNORMAL HIGH (ref 0.0–5.0)
Cholesterol, Total: 225 mg/dL — ABNORMAL HIGH (ref 100–199)
HDL: 44 mg/dL (ref >39)
LDL Chol Calc (NIH): 152 mg/dL — ABNORMAL HIGH (ref 0–99)
Triglycerides: 161 mg/dL — ABNORMAL HIGH (ref 0–149)
VLDL Cholesterol Cal: 29 mg/dL (ref 5–40)

## 2024-05-02 NOTE — Progress Notes (Signed)
 Cardiology Office Note   Date:  05/02/2024  ID:  Maxtyn, Nuzum 1951/02/03, MRN [id number] PCP: Marvene Prentice SAUNDERS, FNP  [facility] Providers Cardiologist:  Emeline FORBES Calender, DO     History of Present Illness Chad Savage is a 73 y.o. male with a past medical history of CAD s/p NSTEMI with DES to mid LAD and mid RCA in 2008, hyperlipidemia, thoracic aneurysm, varicose veins, prediabetes who presents today for annual follow-up.  Previously followed with Dr. Dann until 2024  Previously seen by Jackee Alberts, NP on 04/21/2023 with complaints of leg cramping which was worse with exertion and managed with NSAIDs and was seen by vascular surgery on 06/02/2023 who believed the symptoms to be due to shinsplints and not claudication from arterial insufficiency.  Today, he states he is been doing well without any complaints.  He is able to do 97 push-ups daily without any cardiac symptoms.  He has stopped ezetimibe -simvastatin  due to diarrhea and has not tolerated rosuvastatin or atorvastatin in the past.  He is not taking aspirin and is on Plavix  monotherapy.  He is a non-smoker.  He does have some lower extremity discomfort as was noted at his vascular visit that improves with compression socks.    ROS:  Review of Systems  All other systems reviewed and are negative.   Physical Exam  Physical Exam Vitals and nursing note reviewed.  Constitutional:      Appearance: Normal appearance.  HENT:     Head: Normocephalic and atraumatic.  Eyes:     Conjunctiva/sclera: Conjunctivae normal.  Neck:     Vascular: No carotid bruit.  Cardiovascular:     Rate and Rhythm: Normal rate and regular rhythm.  Pulmonary:     Effort: Pulmonary effort is normal.     Breath sounds: Normal breath sounds.  Musculoskeletal:     Comments: Lower extremity varicose veins  Skin:    Coloration: Skin is not jaundiced or pale.  Neurological:     Mental Status: He is alert.     VS:  BP 109/60   Pulse  (!) 55   Ht 5' 4 (1.626 m)   Wt 162 lb 6.4 oz (73.7 kg)   SpO2 97%   BMI 27.88 kg/m         Wt Readings from Last 3 Encounters:  05/02/24 162 lb 6.4 oz (73.7 kg)  06/02/23 163 lb (73.9 kg)  04/21/23 163 lb 3.2 oz (74 kg)     EKG Interpretation Date/Time:  Wednesday May 02 2024 09:41:39 EST Ventricular Rate:  55 PR Interval:  172 QRS Duration:  80 QT Interval:  414 QTC Calculation: 396 R Axis:   14  Text Interpretation: Sinus bradycardia Low voltage QRS When compared with ECG of 21-Apr-2023 09:46, No significant change was found Confirmed by Calender Emeline [phone]) on 05/02/2024 9:49:22 AM    Studies Reviewed   Risk Assessment/Calculations             ASCVD risk score: The ASCVD Risk score (Arnett DK, et al., 2019) failed to calculate for the following reasons:   Risk score cannot be calculated because patient has a medical history suggesting prior/existing ASCVD   ASSESSMENT  CAD s/p NSTEMI with PCI to mid LAD and RCA in 2008 without angina currently on Plavix  monotherapy and not on statins. 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

## 2024-05-02 NOTE — Patient Instructions (Signed)
 Medication Instructions:  No medication changes were made at this visit. Continue current regimen.  *If you need a refill on your cardiac medications before your next appointment, please call your pharmacy*  Lab Work: Lipid panel today   If you have labs (blood work) drawn today and your tests are completely normal, you will receive your results only by: MyChart Message (if you have MyChart) OR A paper copy in the mail If you have any lab test that is abnormal or we need to change your treatment, we will call you to review the results.  Follow-Up: At [facility], you and your health needs are our priority.  As part of our continuing mission to provide you with exceptional heart care, our providers are all part of one team.  This team includes your primary Cardiologist (physician) and Advanced Practice Providers or APPs (Physician Assistants and Nurse Practitioners) who all work together to provide you with the care you need, when you need it.  Your next appointment:   1 year(s)  Provider:   Emeline FORBES Calender, DO    We recommend signing up for the patient portal called MyChart.  Sign up information is provided on this After Visit Summary.  MyChart is used to connect with patients for Virtual Visits (Telemedicine).  Patients are able to view lab/test results, encounter notes, upcoming appointments, etc.  Non-urgent messages can be sent to your provider as well.   To learn more about what you can do with MyChart, go to [email or web address].   Other Instructions

## 2024-05-03 ENCOUNTER — Ambulatory Visit: Payer: Self-pay | Admitting: Internal Medicine

## 2024-05-03 DIAGNOSIS — I2510 Atherosclerotic heart disease of native coronary artery without angina pectoris: Secondary | ICD-10-CM

## 2024-05-03 DIAGNOSIS — E7800 Pure hypercholesterolemia, unspecified: Secondary | ICD-10-CM

## 2024-05-04 ENCOUNTER — Ambulatory Visit (HOSPITAL_COMMUNITY): Admission: RE | Admit: 2024-05-04 | Discharge: 2024-05-04 | Attending: Internal Medicine

## 2024-05-04 DIAGNOSIS — I7121 Aneurysm of the ascending aorta, without rupture: Secondary | ICD-10-CM

## 2024-05-04 LAB — ECHOCARDIOGRAM COMPLETE
Area-P 1/2: 3 cm2
S' Lateral: 2.46 cm

## 2024-05-20 ENCOUNTER — Other Ambulatory Visit: Payer: Self-pay | Admitting: Nurse Practitioner

## 2024-06-21 ENCOUNTER — Ambulatory Visit: Attending: Cardiology

## 2024-06-21 DIAGNOSIS — E7800 Pure hypercholesterolemia, unspecified: Secondary | ICD-10-CM

## 2024-06-21 MED ORDER — EZETIMIBE 10 MG PO TABS: 10.0000 mg | ORAL_TABLET | Freq: Every day | ORAL | 2 refills | Status: AC

## 2024-06-21 NOTE — Assessment & Plan Note (Addendum)
 Assessment:  LDL goal: < 55 mg/dl; last LDLc 847 mg/dl (87/7974) on no therapy  Most recent liver enzymes wnl Intolerances: ezetimibe -simvastatin  (diarrhea/ generalized weakness), atorvastatin (muscle pain), rosuvastatin (muscle pain) Discussed next potential options (PCSK-9 inhibitors, bempedoic acid and inclisiran); cost, dosing efficacy, side effects  Patient is not interested in injectable therapy or any other discussed therapies at this time - he would like to retry ezetimibe  alone as he thinks simvastatin  caused the side effects and recheck lipid panel in 3-4 months Discussed low likelihood of achieving goal with ezetimibe  alone but patient wishes to try it first Encouraged heart healthy diet and regular exercise  Plan: Start taking ezetimibe  10 mg daily  Lipid lab and hepatic panel due in 3-4 months

## 2024-06-21 NOTE — Progress Notes (Signed)
 Patient ID: Chad Savage                 DOB: 1951-01-27                    MRN: [id number]      HPI: Chad Savage is a 74 y.o. male patient referred to lipid clinic by Dr. Kriste. PMH is significant for CAD s/p NSTEMI with DES to mid LAD and mid RCA in 2008, HLD, and prediabetes.  Patient was last evaluated by Dr. Kriste in December 2025 for follow up. He had stopped ezetimibe -simvastatin  due to diarrhea, and has not tolerated rosuvastatin or atorvastatin in the past. Provider referred patient to [facility] lipid clinic to consider PCSK9i for LDL goal < 55.  Patient presents today in good spiritis to [facility] lipid clinic. He states that he had been off ezetimibe -simvastatin  combo pill  for a couple months up until a few days ago when he started the medication back. He believes the simvastatin  is causing the diarrhea. He reports previously trying the OTC supplement CholestOff (phytosterols) and is interested in restarting it, noting that it seemed effective for him, which found a study showing that adding phytosterols to ezetimibe  further reduced LDL-C and improved cholesterol metabolism.  Reviewed options for lowering LDL cholesterol, including PCSK-9 inhibitors, bempedoic acid and inclisiran.  Discussed mechanisms of action, dosing, side effects and potential decreases in LDL cholesterol.  Also reviewed cost information and potential options for patient assistance.   Current Medications: none  Intolerances: ezetimibe -simvastatin  (diarrhea/ generalized weakness), atorvastatin (muscle pain), rosuvastatin (muscle pain) Risk Factors: age, CAD s/p NSTEMI with DES to mid LAD and mid RCA in 2008  LDL goal: < 55 Lipid panel (04/2024): Chol 225, Trig 161, LDL 152 Liver enzymes (04/2024): ALT 7, AST 19, Alk phos 66  Diet:  Breakfast: Oatmeal with fruit, dried cranberries, brown sugar and Truvia, a small amount of maple syrup, and about 1/4 cup of nuts. Lunch/Dinner: Varies; often includes a sandwich made  with thin-sliced meats and either SunChips or sweet potato chips. Vegetable-based meals such as green beans, corn, baked beans, or salads. Beverages: Water; uses sucralose-based creamer  Exercise:  Completes 98 push-ups along with physical therapy exercises each morning, including bridging and crunches.  Family History:   Relation Problem Comments  Mother (Deceased)   Father (Deceased) Heart attack     Brother Heart attack     Maternal Grandmother (Deceased)   Maternal Grandfather (Deceased)   Paternal Grandmother (Deceased)   Paternal Grandfather (Deceased)   Unknown Heart Problems FAMILY HISTORY      Social History:  Alcohol: none  Smoking: none   Labs:  Lipid Panel     Component Value Date/Time   CHOL 225 (H) 05/02/2024 1037   CHOL 179 03/06/2014 0756   TRIG 161 (H) 05/02/2024 1037   TRIG 159 (H) 03/06/2014 0756   HDL 44 05/02/2024 1037   HDL 52 03/06/2014 0756   CHOLHDL 5.1 (H) 05/02/2024 1037   CHOLHDL 3 08/12/2014 1004   VLDL 17.2 08/12/2014 1004   LDLCALC 152 (H) 05/02/2024 1037   LDLCALC 95 03/06/2014 0756   LABVLDL 29 05/02/2024 1037    Past Medical History:  Diagnosis Date   Coronary artery disease 2008   Hypercholesteremia    Myocardial infarction [facility])    Thoracic aneurysm March 2013   4 cm without complicating features    Medications Ordered Prior to Encounter[1]  Allergies[2]  Assessment/Plan:  1. Hyperlipidemia -  Problem  Hypercholesteremia   Hypercholesteremia Assessment:  LDL goal: < 55 mg/dl; last LDLc 847 mg/dl (87/7974) on no therapy  Most recent liver enzymes wnl Intolerances: ezetimibe -simvastatin  (diarrhea/ generalized weakness), atorvastatin (muscle pain), rosuvastatin (muscle pain) Discussed next potential options (PCSK-9 inhibitors, bempedoic acid and inclisiran); cost, dosing efficacy, side effects  Patient is not interested in injectable therapy or any other discussed therapies at this time - he would like to retry  ezetimibe  alone as he thinks simvastatin  caused the side effects and recheck lipid panel in 3-4 months Discussed low likelihood of achieving goal with ezetimibe  alone but patient wishes to try it first Encouraged heart healthy diet and regular exercise  Plan: Start taking ezetimibe  10 mg daily  Lipid lab and hepatic panel due in 3-4 months     Thank you,  Basem Yannuzzi E. Rachell Druckenmiller, Pharm.D, CPP [facility]. [facility] [address], [place], [place] [address] Phone: [phone]; Fax: [phone]        [1]  Current Outpatient Medications on File Prior to Visit  Medication Sig Dispense Refill   Ascorbic Acid (VITAMIN C) 500 MG CHEW      aspirin 81 MG tablet Take 81 mg by mouth every other day.      clopidogrel  (PLAVIX ) 75 MG tablet TAKE 1 TABLET BY MOUTH DAILY 90 tablet 3   Coenzyme Q10 (CO Q-10) 300 MG CAPS Take 300 mg by mouth daily.     Collagen-Vitamin C (COLLAGEN PLUS VITAMIN C PO)      Glucosamine-Chondroit-Vit C-Mn (GLUCOSAMINE CHONDR 1500 COMPLX PO) Take 1 tablet by mouth 2 (two) times daily. 1500-1200     Multiple Vitamins-Minerals (CENTRUM PO) Take 1 tablet by mouth daily.      nitroGLYCERIN  (NITROSTAT ) 0.4 MG SL tablet Place 1 tablet (0.4 mg total) under the tongue every 5 (five) minutes as needed for chest pain. 25 tablet 3   Omega-3 Fatty Acids (FISH OIL) 1200 MG CAPS Take 1,200 mg by mouth 2 (two) times daily.      POTASSIUM BICARB & CHLORIDE PO Take by mouth every other day.     sildenafil  (REVATIO ) 20 MG tablet TAKE 3 TO 5 TABLETS BY MOUTH ONE HOUR PRIOR TO SEXUAL ACTIVITY AS NEEDED 30 tablet 0   No current facility-administered medications on file prior to visit.  [2]  Allergies Allergen Reactions   Meperidine Other (See Comments)    Cold sweats Shakes Cold sweats   Crestor [Rosuvastatin]     Muscle pain   Lipitor [Atorvastatin]    Pseudoephedrine Hcl Itching   Antihistamines, Chlorpheniramine-Type Rash         Sudafed [Pseudoephedrine Hcl] Rash

## 2024-06-21 NOTE — Patient Instructions (Addendum)
 Your Results:             Your most recent labs Goal  Total Cholesterol 225 < 200  Triglycerides 161 < 150  HDL (happy/good cholesterol) 44 > 40  LDL (lousy/bad cholesterol 152 < 55   Medication changes: Begin ezetimibe  10 mg daily 3 months after starting ezetimibe .  We will send you a lab order to remind you once we get  closer to that time.    Sheba Whaling E. Elza Varricchio, Pharm.D, CPP [facility]. [facility] [address], [place], [place] [address] Phone: [phone]; Fax: [phone]     Praluent is a cholesterol medication that improved your body's ability to get rid of bad cholesterol known as LDL. It can lower your LDL up to 60%. It is an injection that is given under the skin every 2 weeks. The most common side effects of Praluent include runny nose, symptoms of the common cold, rarely flu or flu-like symptoms, back/muscle pain in about 3-4% of the patients, and redness, pain, or bruising at the injection site.    Repatha is a cholesterol medication that improved your body's ability to get rid of bad cholesterol known as LDL. It can lower your LDL up to 60%! It is an injection that is given under the skin every 2 weeks. The medication often requires a prior authorization from your insurance company. We will take care of submitting all the necessary information to your insurance company to get it approved. The most common side effects of Repatha include runny nose, symptoms of the common cold, rarely flu or flu-like symptoms, back/muscle pain in about 3-4% of the patients, and redness, pain, or bruising at the injection site.    It is also recommended that patients with high cholesterol adhere to a heart healthy diet, get regular exercise, avoid use of tobacco products, and maintain a healthy weight. Steps that you can take to help in these areas:  Limit consumption of trans fats, saturated fats, and cholesterol in your diet  Increase intake of  lean meats such as chicken, turkey, and fish  Increase intake of foods rich in fiber such as fresh fruits, vegetables, beans and oatmeal Exercise as you are able; even 30 minutes of walking daily can aid in increasing heart health   Copay Assistance:  The [employer] foundation offers assistance to help pay for medication copays.  They will cover copays for all cholesterol lowering meds, including statins, fibrates, omega-3 oils, ezetimibe , Repatha, Praluent, Nexletol, Nexlizet.  The cards are usually good for $2,500 or 12 months, whichever comes first. Go to [employer]wellfoundation.org Click on Apply Now Answer questions as to whom is applying (patient or representative) Your disease fund will be hypercholesterolemia - Medicare access Select the cholesterol medication you need assistance with (Repatha, Praluent, Nexlizet...) They will ask question about qualifying diagnosis - you can mark yes; and do you have insurance coverage.   When they ask what type of assistance you are interested in - copay assistance When you submit, the approval is usually within minutes.  You will need to print the card information from the site You will need to show this information to your pharmacy, they will bill your Medicare Part D plan first -then bill [employer] --for the copay.   You can also call them at [phone], although the hold times can be quite long.

## 2024-07-03 ENCOUNTER — Other Ambulatory Visit: Payer: Self-pay | Admitting: Cardiovascular Disease

## 2024-07-04 NOTE — Telephone Encounter (Signed)
 Will refill for now since we have previously filled this medication but defer further refills to patient's PCP.  This should not be taken within 24 hours of nitroglycerin  use and vice versa.  Thank you, Emeline Calender, DO
